# Patient Record
Sex: Female | Born: 1970 | Race: White | Hispanic: No | Smoking: Former smoker
Health system: Southern US, Community
[De-identification: ages and names within clinical notes are randomized; demographics above are authoritative.]

## PROBLEM LIST (undated history)

## (undated) DIAGNOSIS — R42 Dizziness and giddiness: Secondary | ICD-10-CM

## (undated) DIAGNOSIS — N870 Mild cervical dysplasia: Secondary | ICD-10-CM

## (undated) DIAGNOSIS — B009 Herpesviral infection, unspecified: Secondary | ICD-10-CM

## (undated) HISTORY — DX: Mild cervical dysplasia: N87.0

## (undated) HISTORY — DX: Dizziness and giddiness: R42

## (undated) HISTORY — PX: BREAST BIOPSY: SHX20

## (undated) HISTORY — DX: Herpesviral infection, unspecified: B00.9

## (undated) HISTORY — PX: COLPOSCOPY: SHX161

---

## 1997-03-25 ENCOUNTER — Encounter (HOSPITAL_COMMUNITY): Admission: RE | Admit: 1997-03-25 | Discharge: 1997-06-23 | Payer: Self-pay | Admitting: Obstetrics and Gynecology

## 1998-08-21 ENCOUNTER — Other Ambulatory Visit: Admission: RE | Admit: 1998-08-21 | Discharge: 1998-08-21 | Payer: Self-pay | Admitting: Obstetrics and Gynecology

## 1999-08-24 ENCOUNTER — Other Ambulatory Visit: Admission: RE | Admit: 1999-08-24 | Discharge: 1999-08-24 | Payer: Self-pay | Admitting: Obstetrics and Gynecology

## 2000-11-21 ENCOUNTER — Other Ambulatory Visit: Admission: RE | Admit: 2000-11-21 | Discharge: 2000-11-21 | Payer: Self-pay | Admitting: Obstetrics and Gynecology

## 2003-02-23 HISTORY — PX: GYNECOLOGIC CRYOSURGERY: SHX857

## 2003-06-23 DIAGNOSIS — N870 Mild cervical dysplasia: Secondary | ICD-10-CM

## 2003-06-23 HISTORY — DX: Mild cervical dysplasia: N87.0

## 2004-12-14 ENCOUNTER — Other Ambulatory Visit: Admission: RE | Admit: 2004-12-14 | Discharge: 2004-12-14 | Payer: Self-pay | Admitting: Gynecology

## 2005-08-12 ENCOUNTER — Inpatient Hospital Stay (HOSPITAL_COMMUNITY): Admission: AD | Admit: 2005-08-12 | Discharge: 2005-08-13 | Payer: Self-pay | Admitting: Gynecology

## 2005-08-16 ENCOUNTER — Inpatient Hospital Stay (HOSPITAL_COMMUNITY): Admission: AD | Admit: 2005-08-16 | Discharge: 2005-08-16 | Payer: Self-pay | Admitting: Gynecology

## 2005-09-10 ENCOUNTER — Inpatient Hospital Stay (HOSPITAL_COMMUNITY): Admission: RE | Admit: 2005-09-10 | Discharge: 2005-09-13 | Payer: Self-pay | Admitting: Gynecology

## 2005-10-28 ENCOUNTER — Other Ambulatory Visit: Admission: RE | Admit: 2005-10-28 | Discharge: 2005-10-28 | Payer: Self-pay | Admitting: Gynecology

## 2007-02-23 HISTORY — PX: TUBAL LIGATION: SHX77

## 2007-05-29 ENCOUNTER — Inpatient Hospital Stay (HOSPITAL_COMMUNITY): Admission: AD | Admit: 2007-05-29 | Discharge: 2007-05-31 | Payer: Self-pay | Admitting: Obstetrics & Gynecology

## 2007-05-29 ENCOUNTER — Encounter (INDEPENDENT_AMBULATORY_CARE_PROVIDER_SITE_OTHER): Payer: Self-pay | Admitting: Obstetrics and Gynecology

## 2008-03-28 ENCOUNTER — Ambulatory Visit: Payer: Self-pay | Admitting: Gynecology

## 2008-03-28 ENCOUNTER — Other Ambulatory Visit: Admission: RE | Admit: 2008-03-28 | Discharge: 2008-03-28 | Payer: Self-pay | Admitting: Gynecology

## 2008-03-28 ENCOUNTER — Encounter: Payer: Self-pay | Admitting: Gynecology

## 2008-12-31 ENCOUNTER — Ambulatory Visit: Payer: Self-pay | Admitting: Gynecology

## 2009-07-03 ENCOUNTER — Ambulatory Visit: Payer: Self-pay | Admitting: Gynecology

## 2009-07-03 ENCOUNTER — Other Ambulatory Visit: Admission: RE | Admit: 2009-07-03 | Discharge: 2009-07-03 | Payer: Self-pay | Admitting: Gynecology

## 2010-07-06 ENCOUNTER — Encounter (INDEPENDENT_AMBULATORY_CARE_PROVIDER_SITE_OTHER): Payer: 59 | Admitting: Gynecology

## 2010-07-06 ENCOUNTER — Other Ambulatory Visit: Payer: Self-pay | Admitting: Gynecology

## 2010-07-06 ENCOUNTER — Other Ambulatory Visit (HOSPITAL_COMMUNITY)
Admission: RE | Admit: 2010-07-06 | Discharge: 2010-07-06 | Disposition: A | Discharge status: Home / Self Care | Payer: 59 | Source: Ambulatory Visit | Attending: Gynecology | Admitting: Gynecology

## 2010-07-06 DIAGNOSIS — Z124 Encounter for screening for malignant neoplasm of cervix: Secondary | ICD-10-CM | POA: Insufficient documentation

## 2010-07-06 DIAGNOSIS — Z833 Family history of diabetes mellitus: Secondary | ICD-10-CM

## 2010-07-06 DIAGNOSIS — Z01419 Encounter for gynecological examination (general) (routine) without abnormal findings: Secondary | ICD-10-CM

## 2010-07-06 DIAGNOSIS — Z13220 Encounter for screening for lipoid disorders: Secondary | ICD-10-CM

## 2010-07-07 NOTE — Op Note (Signed)
NAME:  [name] [name]                ACCOUNT NO.:  [id number]   MEDICAL RECORD NO.:  [id number]          PATIENT TYPE:  INP   LOCATION:  9108                          FACILITY:  [facility]   PHYSICIAN:  Kendra H. Tenny Craw, MD     DATE OF BIRTH:  05/14/1970   DATE OF PROCEDURE:  05/29/2007  DATE OF DISCHARGE:                               OPERATIVE REPORT   ATTENDING PHYSICIAN:  Dr. Almon Hercules.   PREOPERATIVE DIAGNOSES:  1. 38 and 4-week intrauterine pregnancy.  2. History of cesarean section x2.  3. Labor.  4. Desired permanent sterilization.   POSTOPERATIVE DIAGNOSES:  1. 38 and 4-week intrauterine pregnancy.  2. History of cesarean section x2.  3. Labor.  4. Desired permanent sterilization.  5. Adhesive disease   PROCEDURE:  Repeat low transverse cesarean section via Pfannenstiel's  skin incision.  Bilateral partial salpingectomy with Pomeroy procedure  and lysis of adhesions.   FINDINGS:  There was a vigorous female infant in vertex presentation  with Apgar scores of 9 and 9, thick meconium-stained fluid was noted,  weight was not available at the time of this dictation.   PROCEDURE:  [name] [name] is a G3, P2 at 77 and [redacted] weeks gestational age  who presents to maternity admissions complaining of painful  contractions.  In maternity admissions she was noted to be contracting  every 2-3 minutes painfully, her cervix was 1 complete and -1 station.  Given her history of two prior cesarean sections, the decision was made  to proceed with repeat cesarean section for labor.  Additionally, the  patient desired permanent sterilization and the risks, benefits and  alternatives of this were discussed with the patient and we elected to  proceed with repeat cesarean section with bilateral partial  salpingectomy.  Following the appropriate informed consent.  The patient  was brought to the operating room where spinal anesthesia was  administered and found to be adequate.  She was placed in  dorsal supine  position with leftward tilt, prepped and draped in the normal sterile  fashion.  Scalpel was used to make a Pfannenstiel's skin incision which  was carried down through the underlying layers of soft tissue to the  fascia.  Fascia was incised in the midline.  Superior aspect of fascial  incision was grasped with Kocher clamps x2, tented up.  Underlying  rectus muscle was dissected off sharply with electrocautery unit.  The  same procedure was repeated on the inferior aspect of the fascial  incision.  The rectus muscles were then separated in the midline.  Abdominal peritoneum was identified, tented up, entered sharply with the  Metzenbaum and the incision was extended superiorly and inferiorly with  good visualization of the bladder.  Significant adhesive disease  involving the bladder and lower uterine segment were noted.  The  vesicouterine peritoneum was identified, tented up, entered sharply with  Metzenbaum and the incision was extended laterally with the Metzenbaums  and the bladder flap was created digitally.  Additionally, tubes and  lysis of adhesions was required with sharp dissection to get the bladder  off of the lower uterine segment.  A scalpel was then used to make a low  transverse incision on the uterus which was extended laterally with  blunt dissection.  Amniotomy was then performed.  Thick meconium-stained  fluid was noted at the time of amniotomy.  The fetal vertex was  identified, brought up through the uterine incision and delivered easily  followed by the body.  The infant cried vigorously on the operative  field, was bulb suctioned.  Cord was clamped and cut.  Infant was passed  to the waiting neonatologist.  Placenta was then manually extracted.  Uterus was exteriorized, cleared of all clot and debris.  An extension  of the uterine incision down the lower uterine segment to the level of  the cervix was noted.  This was repaired with a #1 chromic in a  running  locked fashion.  The uterine incision was then repaired with #1 chromic  in a running locked fashion and a second imbricating layer was not  performed, given tubal sterilization.  Uterine incision was found to be  hemostatic.  Attention was then turned to the tubes, first to the left  hand tube which was tented up with Babcock clamp, tied off x2 with 2-0  plain gut and the intervening portion of tube was then transected and  removed.  The same procedure was repeated on the right hand fallopian  tube.  The tubal ostia were noted bilaterally with hemostasis noted.  The uterus was then returned to the abdominal cavity.  The abdominal  cavity, cleared of all clot and debris.  The uterine incision was  reinspected and again found to be hemostatic.  The bladder was inspected  and found to be hemostatic and without the concern for injury.  The  urine was noted to be clear at this time and remained so throughout the  entire procedure.  The vesicouterine peritoneum was reapproximated with  2-0 Vicryl in a running fashion.  The rectus muscles were then  reapproximated with two number one figure-of-eight sutures and the  fascia was closed with a looped PDS and the skin was closed with  staples.  All sponge, lap, needle counts were correct x2.  The patient  tolerated the procedure well and was brought to the recovery room in  stable condition.      [name] March. Tenny Craw, MD  Electronically Signed     KHR/MEDQ  D:  05/29/2007  T:  05/30/2007  Job:  [id number]

## 2010-07-10 NOTE — H&P (Signed)
NAME:  [name] [name], [name] [name]                ACCOUNT NO.:  [id number]   MEDICAL RECORD NO.:  [id number]          PATIENT TYPE:  INP   LOCATION:  9156                          FACILITY:  [facility]   PHYSICIAN:  Juan H. Lily Peer, M.D.DATE OF BIRTH:  February 03, 1971   DATE OF ADMISSION:  08/12/2005  DATE OF DISCHARGE:                                HISTORY & PHYSICAL   CHIEF COMPLAINT:  1.  A 34-3/[redacted] weeks gestation.  2.  Oligohydramnios.  3.  Previous cesarean section.  4.  Prior history of premature delivery at [redacted] weeks gestation.   HISTORY:  Patient is a 40 year old gravida 2, para 1 with a last menstrual  period of December 14, 2004.  Estimated date of confinement September 20, 2005.  Currently 34-3/[redacted] weeks gestation.  Patient presented to the office today  stating that she was having brownish discharge and questionable cramps and  pressure in her lower abdomen.  A fetal fibronectin was done.  A wet prep  was done.  The wet prep demonstrated evidence of monilial lis.  The fetal  fibronectin result came back, and at the time of this dictation, it was  reported to be negative.  An ultrasound was done, due to the fact there was  a question, there was thinning of the lower uterine segment.  The cervix was  found to be 3 cm and closed.  Estimated fetal weight of 5 pounds, 3 ounces  in the breech presentation.  The AFI was low at 6.5 cm and 2 percentile for  gestational age.  Patient had a prior cesarean section at breech because of  the preterm premature rupture of membranes at [redacted] weeks gestation.  Patient  denied any ill symptoms.  No rupture of membranes reported.  With the  patient's history of premature rupture of membranes with preterm delivery  and her brownish discharge, it was decided to bring the patient into the  hospital for intravenous hydration secondary to oligohydramnios and  continuous monitoring of the fetus with follow-up  ultrasound in 24 hours in  the hospital.  Also, we had discussed  betamethasone for fetal lung  maturation in the event of imminent delivery.  Finally, we had discussed as  well the possibility of administering 17 alpha hydroxyprogesterone Caproate  weekly until 36 weeks to prevent premature delivery.  The patient has been  scheduled for a repeat cesarean section on Friday, July 20th.  The patient's  prenatal course secondary to fact of advanced maternal age, she was offered  genetic amniocentesis and/or first trimester screening, she opted to proceed  with a first trimester screening, which was reported to be normal, and the  alpha fetoprotein at 15-1/[redacted] weeks gestation, was normal.  She had declined  cystic fibrosis screening.  She had smoked during her pregnancy and stopped.  Also, she had been on an antidepressant agent, such as Wellbutrin, through  her pregnancy as well.   PAST MEDICAL HISTORY:  1.  Past history of preterm premature rupture of membranes and delivered via      cesarean section secondary to breech at [redacted] week gestation in  the past.  2.  Prior history of cigarette consumption.  3.  Depression, on Wellbutrin.   ALLERGIES:  None.   REVIEW OF SYSTEMS:  See Hollister form.   PHYSICAL EXAMINATION:  VITAL SIGNS:  Blood pressure 118/76.  Urine was  negative for protein or glucose.  Weight 181 pounds.  HEENT:  Unremarkable.  NECK:  Supple.  Trachea is midline.  No carotid bruits.  No thyromegaly.  LUNGS:  Clear to auscultation without any rhonchi or wheezes.  HEART:  Regular rate and rhythm.  No murmurs or gallops.  BREASTS:  Not done.  ABDOMEN:  Soft and nontender.  Breech presentation.  Positive fetal heart  tones.  PELVIC:  Cervix closed, 90% effaced, high.  EXTREMITIES:  DTRs 1+.  Negative clonus.   PRENATAL LABS:  O+ blood type.  Negative antibody screen.  VDRL was  nonreactive.  Rubella immune.  Hepatitis B surface antigen and HIV were  negative.  Diabetes screen was abnormal.  The patient had a normal three  hour GTT.    ASSESSMENT:  A 40 year old gravida 2, para 1 at 34-[redacted] weeks gestation with  brownish discharge on presentation to the office today.  History of  premature rupture of membranes and delivery at [redacted] weeks gestation via  cesarean section.  The patient on ultrasound was found to have a cervix of 3  cm.  Fetal heart tones were appreciated, but the AFI was low at 6.5 cm.  Estimated fetal weight was 5 pounds, 3 ounces, and also in the breech  presentation.  The patient will be admitted to the hospital for IV  hydration, rest, as well as for continuous monitoring.  Will repeat the  ultrasound in the hospital tomorrow to include Doppler flow studies.  The  patient's fetal fibronectin was negative.  Will admission betamethasone 12.5  mg IM upon admission and repeat in 24 hours, in the event of imminent  delivery.  She will use Monistat while at the hospital intravaginally for  three days.  We have discussed administering 17 alpha hydroxyprogesterone  Caproate (17P) 250 mg IM and then weekly until [redacted] week gestation in an  effort to prevent preterm delivery.  The risks, benefits, and pro's and  con's of all the above were discussed with the patient and the concerns.  All questions were answered.  Will follow accordingly.Gaetano Hawthorne. Lily Peer, M.D.  Electronically Signed     JHF/MEDQ  D:  08/12/2005  T:  08/12/2005  Job:  [id number]

## 2010-07-10 NOTE — Op Note (Signed)
NAME:  SCHNEPF, Jennae                ACCOUNT NO.:  [id number]   MEDICAL RECORD NO.:  [id number]          PATIENT TYPE:  INP   LOCATION:  9139                          FACILITY:  [facility]   PHYSICIAN:  Timothy P. Fontaine, M.D.DATE OF BIRTH:  1970/04/14   DATE OF PROCEDURE:  09/10/2005  DATE OF DISCHARGE:                                 OPERATIVE REPORT   PREOPERATIVE DIAGNOSES:  Term pregnancy, prior cesarean section, desires  repeat cesarean section, breech presentation.   POSTOPERATIVE DIAGNOSES:  Term pregnancy, prior cesarean section, desires  repeat cesarean section, breech presentation.   PROCEDURE:  Repeat low transverse cervical cesarean section.   SURGEON:  Timothy P. Fontaine, M.D.   ASSISTANT:  Reynaldo Minium, M.D.   ANESTHETIC:  Spinal.   ESTIMATED BLOOD LOSS:  Less than 500 mL.   COMPLICATIONS:  None.   SPECIMENS:  1.  Cord blood  2.  Public cord blood banking.   FINDINGS:  At 7:47 normal female, Apgars 08/09, weight 6 pounds 2 ounces.  Pelvic anatomy noted to be normal.  Infant was in the footling breech  presentation.   PROCEDURE:  The patient underwent spinal anesthesia, was placed left tilt  supine position, received an abdominal preparation with Betadine solution  and Foley catheter was placed in sterile technique and the patient was  draped in usual fashion per nursing personnel.  After assuring adequate  anesthesia, the abdomen sharply entered through repeat Pfannenstiel incision  achieving adequate hemostasis at all levels.  Bladder flap was sharply  bluntly developed without difficulty and the uterus was sharply entered in  the lower uterine segment and bluntly extended laterally.  Bulging membranes  were ruptured.  Fluid noted to be clear.  The infant was in the footling  breech presentation and underwent a breech extraction delivery.  The nares  and mouth were suctioned.  The cord doubly clamped and cut and the infant  was handed to pediatrics in  attendance.  Samples of cord blood were  obtained.  Placenta was spontaneously extruded, noted to be intact and was  handed off for public cord blood banking collection.  The uterus was  exteriorized, endometrial cavity explored with the sponge to remove all  placental membrane fragments.  The patient received 1 gram Ancef antibiotic  prophylaxis at this time.  Uterine incision was then closed in two layers  using 0 Vicryl suture first in a running interlocking stitch followed by  imbricating stitch.  Uterus was returned to the abdomen which was copiously  irrigated.  Adequate hemostasis visualized.  Anterior fascia was then  reapproximated using 0 Vicryl suture starting at the angle, meeting in the  middle.  Subcutaneous tissues were irrigated.  Adequate  hemostasis achieved with electrocautery.  The skin reapproximated with 4-0  Vicryl in a running subcuticular stitch.  Steri-Strips, Benzoin applied,  pressure dressing applied.  The patient taken to recovery room in good  condition having tolerated procedure well.      Timothy P. Fontaine, M.D.  Electronically Signed     TPF/MEDQ  D:  09/10/2005  T:  09/10/2005  Job:  [id number] 

## 2010-07-10 NOTE — H&P (Signed)
NAME:  [name] [name]                ACCOUNT NO.:  [id number]   MEDICAL RECORD NO.:  [id number]          PATIENT TYPE:  INP   LOCATION:                                [facility]   PHYSICIAN:  Timothy P. Fontaine, M.D.DATE OF BIRTH:  [birth year]   DATE OF ADMISSION:  09/10/2005  DATE OF DISCHARGE:                                HISTORY & PHYSICAL   CHIEF COMPLAINT:  1.  Pregnancy at term.  2.  Prior cesarean section for repeat cesarean section.   HISTORY OF PRESENT ILLNESS:  A 40 year old G39, P72 female at term gestation  with history of prior cesarean section for breech presentation who desires  repeat cesarean section.  Pregnancy had been complicated by low AFI for  which she has been followed with stable values in the 10 range.  Her  antepartum testing, to include NSTs, have been reassuring and her pregnancy  otherwise has been uncomplicated.  Beta Strep noted to be negative.  For  remainder of history, see [name].   PHYSICAL EXAMINATION:  HEENT:  Normal.  LUNGS:  Clear.  CARDIAC:  Regular rate.  No rubs, murmurs or gallops.  ABDOMEN:  Gravid fundus consistent with term.  Positive fetal heart tones.  PELVIC:  Deferred.   ASSESSMENT AND PLAN:  A 40 year old G2, P37 female, term gestation, history  of prior cesarean section, who desires repeat cesarean section.  She has  been noted to be in breech presentation on recent ultrasound, but again  regardless, even if there was a spontaneous version, she would want to  proceed with a planned repeat cesarean section.  I reviewed with her what is  involved with a repeat cesarean section to include the expected  intraoperative, postoperative courses, use of spinal anesthesia.  The risks  of infection requiring prolonged antibiotics, re-operation for abscess  drainage, wound complications requiring opening and draining of wounds,  closure by secondary intention, was all discussed, understood and accepted.  The risks of inadvertant  injury to internal organs, including bowel,  bladder, ureters, vessels, and nerves necessitating major exploratory  reparative surgeries and future reparative surgeries, including ostomy  formation was all discussed, understood and accepted.  The risks of fetal  injury during the birthing process, including musculoskeletal, neural, and  scalp injuries were all reviewed with her.  Again, the infant had been in  the breech presentation during antepartum assessments, but even if there was  a spontaneous version at the time of cesarean section, she would prefer a  repeat cesarean section and declines trial of labor options.  The patient's  questions were answered to her satisfaction and she is ready to proceed with  surgery.      Timothy P. Fontaine, M.D.  Electronically Signed     TPF/MEDQ  D:  09/09/2005  T:  09/09/2005  Job:  [id number]

## 2010-07-10 NOTE — Discharge Summary (Signed)
NAME:  [name] [name], [name] [name]                ACCOUNT NO.:  [id number]   MEDICAL RECORD NO.:  [id number]          PATIENT TYPE:  INP   LOCATION:  9139                          [facility]ACILITY:  [facility]   PHYSICIAN:  Timothy P. Fontaine, M.D.DATE OF BIRTH:  10-28-1970   DATE OF ADMISSION:  09/10/2005  DATE OF DISCHARGE:  09/13/2005                                 DISCHARGE SUMMARY   DISCHARGE DIAGNOSES:  1.  Pregnancy at term, delivered.  2.  Breech presentation.  3.  Prior Cesarean section, for repeat Cesarean section.  4.  Anemia secondary to pregnancy and blood loss.   PROCEDURE:  Repeat low transverse cervical Cesarean section, September 10, 2005,  Dr. Colin Broach.   HOSPITAL COURSE:  Patient is a 40 year old G2, P77 female with history of  prior Cesarean section for repeat Cesarean section, also noted that the  infant was in the breech presentation.  The patient underwent an  uncomplicated repeat low transverse cervical Cesarean section September 10, 2005  producing a normal female infant, weight 6 pounds, 2 ounces, Apgar's of 8  and 9 in the footling breech presentation.  The patient's postoperative  course was uncomplicated.  She was discharged home on postoperative day #3  ambulating well, tolerating a regular diet with a postoperative hemoglobin  of 8.7.  The patient's blood type is O positive and her rubella titer is  positive.  She received precautions, instructions and followup and will be  seen in the office in six weeks following discharge and was given a  prescription for Tylox #25, one to two p.o. q.4-6h. p.r.n. pain.      Timothy P. Fontaine, M.D.  Electronically Signed     TPF/MEDQ  D:  09/13/2005  T:  09/13/2005  Job:  [id number]

## 2010-07-10 NOTE — Discharge Summary (Signed)
NAMECITLALI, [name] [name]                ACCOUNT NO.:  [id number]   MEDICAL RECORD NO.:  [id number]          PATIENT TYPE:  INP   LOCATION:  9108                          [facility]   PHYSICIAN:  Ilda Mori, M.D.   DATE OF BIRTH:  February 19, 1971   DATE OF ADMISSION:  05/29/2007  DATE OF DISCHARGE:  05/31/2007                               DISCHARGE SUMMARY   FINAL DIAGNOSES:  1. Intrauterine gestation at 11 and 4/7th weeks.  2. History of prior cesarean sections x2.  3. Active labor.  4. Desires permanent sterilization.  5. Lower uterine adhesions.   PROCEDURE:  Repeat low transverse cesarean section, bilateral partial  salpingectomy with Pomeroy procedure, and lysis of adhesions.   SURGEON:  Freddrick March. Tenny Craw, MD   COMPLICATIONS:  None.   HOSPITAL COURSE:  This 40 year old G3, P 2-0-0-2 presents at 48 and  4/7th weeks' gestation in active labor.  The patient's antepartum course  up to this point had been complicated by advanced maternal age.  The  patient did have a first trimester screen that did show an elevated Down  syndrome risk.  The patient was seen perinatal and declined  amniocentesis.  Otherwise, the patient's antepartum course has been  uncomplicated.  She did have a history of two prior cesarean sections  and desires permanent sterilization after this pregnancy as well.  The  patient was taken to the operating room on May 29, 2007 where a repeat  low transverse cesarean section was performed with the delivery of a 6  pound 3 ounce female infant with Apgars of 9 and 9.  The delivery went  without complications.  There were some adhesions that were lysed.  The  patient still expressed her desires for permanent sterilization which  was performed without complications.  The patient's postoperative course  was benign without any significant fevers.  The patient was felt ready  for discharge on postoperative day #2.   DISCHARGE INSTRUCTIONS:  She was sent home on a regular  diet, told to  decrease activities, told to continue her prenatal vitamins and her  Wellbutrin 150 mg daily that she was already taking.  She was given  Tylox #30 1-2 every 4 hours as needed for pain, told she could use over-  the-counter ibuprofen up to 600 mg every 6 hours as needed for pain and  was to follow up in our office in 4 weeks.  Instructions and precautions  were reviewed with the patient.   LABS ON DISCHARGE:  The patient had a hemoglobin of 10.1, white blood  cell count of 15.1, and platelets of 210,000.      Leilani Able, P.A.-C.      Ilda Mori, M.D.  Electronically Signed    MB/MEDQ  D:  06/20/2007  T:  06/21/2007  Job:  [id number]

## 2010-07-21 ENCOUNTER — Other Ambulatory Visit: Payer: 59

## 2010-07-21 ENCOUNTER — Ambulatory Visit (INDEPENDENT_AMBULATORY_CARE_PROVIDER_SITE_OTHER): Payer: 59 | Admitting: Gynecology

## 2010-07-21 DIAGNOSIS — N831 Corpus luteum cyst of ovary, unspecified side: Secondary | ICD-10-CM

## 2010-07-21 DIAGNOSIS — N949 Unspecified condition associated with female genital organs and menstrual cycle: Secondary | ICD-10-CM

## 2010-07-21 DIAGNOSIS — N8300 Follicular cyst of ovary, unspecified side: Secondary | ICD-10-CM

## 2010-11-17 LAB — CBC
Hemoglobin: 10.1 — ABNORMAL LOW
MCHC: 34.4
MCV: 89.2
MCV: 90.2
Platelets: 210
Platelets: 254
RBC: 3.81 — ABNORMAL LOW
RDW: 14
WBC: 15.1 — ABNORMAL HIGH

## 2010-11-17 LAB — TYPE AND SCREEN: ABO/RH(D): O POS

## 2010-11-17 LAB — RPR: RPR Ser Ql: NONREACTIVE

## 2011-07-07 ENCOUNTER — Encounter: Payer: Self-pay | Admitting: Gynecology

## 2011-07-07 ENCOUNTER — Ambulatory Visit (INDEPENDENT_AMBULATORY_CARE_PROVIDER_SITE_OTHER): Payer: 59 | Admitting: Gynecology

## 2011-07-07 VITALS — BP 122/70 | Ht 64.6 in | Wt 166.0 lb

## 2011-07-07 DIAGNOSIS — G4700 Insomnia, unspecified: Secondary | ICD-10-CM

## 2011-07-07 DIAGNOSIS — N898 Other specified noninflammatory disorders of vagina: Secondary | ICD-10-CM

## 2011-07-07 DIAGNOSIS — B009 Herpesviral infection, unspecified: Secondary | ICD-10-CM | POA: Insufficient documentation

## 2011-07-07 DIAGNOSIS — Z13220 Encounter for screening for lipoid disorders: Secondary | ICD-10-CM

## 2011-07-07 DIAGNOSIS — R5383 Other fatigue: Secondary | ICD-10-CM

## 2011-07-07 DIAGNOSIS — R5381 Other malaise: Secondary | ICD-10-CM

## 2011-07-07 DIAGNOSIS — Z131 Encounter for screening for diabetes mellitus: Secondary | ICD-10-CM

## 2011-07-07 DIAGNOSIS — Z01419 Encounter for gynecological examination (general) (routine) without abnormal findings: Secondary | ICD-10-CM

## 2011-07-07 LAB — CBC WITH DIFFERENTIAL/PLATELET
Basophils Absolute: 0.1 10*3/uL (ref 0.0–0.1)
Basophils Relative: 1 % (ref 0–1)
HCT: 40.9 % (ref 36.0–46.0)
Hemoglobin: 13.3 g/dL (ref 12.0–15.0)
MCH: 29.6 pg (ref 26.0–34.0)
MCHC: 32.5 g/dL (ref 30.0–36.0)
Monocytes Absolute: 0.5 10*3/uL (ref 0.1–1.0)
Monocytes Relative: 5 % (ref 3–12)
Neutro Abs: 4.8 10*3/uL (ref 1.7–7.7)
RDW: 13.8 % (ref 11.5–15.5)

## 2011-07-07 LAB — LIPID PANEL: LDL Cholesterol: 138 mg/dL — ABNORMAL HIGH (ref 0–99)

## 2011-07-07 LAB — GLUCOSE, RANDOM: Glucose, Bld: 83 mg/dL (ref 70–99)

## 2011-07-07 LAB — WET PREP FOR TRICH, YEAST, CLUE
Clue Cells Wet Prep HPF POC: NONE SEEN
Yeast Wet Prep HPF POC: NONE SEEN

## 2011-07-07 MED ORDER — BUPROPION HCL ER (XL) 150 MG PO TB24: 150.0000 mg | ORAL_TABLET | Freq: Every day | ORAL | Status: DC

## 2011-07-07 NOTE — Patient Instructions (Addendum)
Schedule mammogram. Call if sleeping continues to be an issue. Follow up in one year for annual gynecologic exam.

## 2011-07-07 NOTE — Progress Notes (Signed)
[name] [name] [birth year] [id number]        41 y.o.  for annual exam.  Several issues noted below  Past medical history,surgical history, medications, allergies, family history and social history were all reviewed and documented in the EPIC chart. ROS:  Was performed and pertinent positives and negatives are included in the history.  Exam: [name] [name] chaperone present Filed Vitals:   07/07/11 1155  BP: 122/70   General appearance  Normal Skin grossly normal Head/Neck normal with no cervical or supraclavicular adenopathy thyroid normal Lungs  clear Cardiac RR, without RMG Abdominal  soft, nontender, without masses, organomegaly or hernia Breasts  examined lying and sitting without masses, retractions, discharge or axillary adenopathy. Pelvic  Ext/BUS/vagina  normal slight white discharge.  Cervix  normal   Uterus  anteverted, normal size, shape and contour, midline and mobile nontender   Adnexa  Without masses or tenderness    Anus and perineum  normal   Rectovaginal  normal sphincter tone without palpated masses or tenderness.    Assessment/Plan:  41 y.o. female for annual exam.   Regular menses status post tubal sterilization. 1. Menorrhagia. Patient's menses tend to be heavier since her tubal sterilization. They are regular and monthly. Options for management were reviewed to include hormonal manipulation up to and including endometrial ablation. Patient's not interested in doing anything at this point. She'll ago and she wants to pursue. 2. Vaginal discharge. Patient notes that since her menses she's had a slight discharge. Her wet prep is unremarkable and I asked her to just watch it for now and if it persists to call me, as long as it resolves and will follow. 3. Insomnia. Patient is having some difficulty staying asleep. She is exercising regularly and falls asleep but then wakes up in the middle of the night. She takes little caffeine. Recommended OTC options. If it continues she'll  call me and I prescribed Ambien 10 mg to see if we can break the cycle. 4. Fatigue. Patient again is to start an exercise program notes some fatigue. We'll check a baseline TSH along with her CBC but assuming normal she'll continue with her exercise program and I think this will work itself out. 5. Mammography. Patients never had a mammogram and I strongly urged her to schedule this and she agrees to do so. SBE much we reviewed. 6. Pap smear. Patient had low-grade SIL with cryo-in 2005. Her subsequent Pap smears have all been normal. I discussed current screening guidelines. Her last Pap smear was 2012 I did not do a Pap smear today and will plan on every 3-5 year screen. 7. Wellbutrin. Patient is on Wellbutrin doing well wants to continue and I refilled her times a year. 8. Health maintenance. Baseline CBC glucose lipid profile urinalysis ordered along with her TSH. Assuming she continues well then she'll see me in a year, sooner as needed.    Dara Lords MD, 12:35 PM 07/07/2011

## 2011-07-08 ENCOUNTER — Other Ambulatory Visit: Payer: Self-pay | Admitting: Gynecology

## 2011-07-08 DIAGNOSIS — E785 Hyperlipidemia, unspecified: Secondary | ICD-10-CM

## 2011-07-08 LAB — URINALYSIS W MICROSCOPIC + REFLEX CULTURE
Casts: NONE SEEN
Crystals: NONE SEEN
Nitrite: NEGATIVE
Specific Gravity, Urine: <1.005 — ABNORMAL LOW (ref 1.005–1.030)
Squamous Epithelial / LPF: NONE SEEN
Urobilinogen, UA: 0.2 mg/dL (ref 0.0–1.0)

## 2012-07-03 ENCOUNTER — Other Ambulatory Visit: Payer: Self-pay | Admitting: Gynecology

## 2012-07-20 ENCOUNTER — Encounter: Payer: Self-pay | Admitting: Gynecology

## 2012-07-20 ENCOUNTER — Ambulatory Visit (INDEPENDENT_AMBULATORY_CARE_PROVIDER_SITE_OTHER): Payer: 59 | Admitting: Gynecology

## 2012-07-20 VITALS — BP 110/74 | Ht 65.0 in | Wt 144.0 lb

## 2012-07-20 DIAGNOSIS — Z13220 Encounter for screening for lipoid disorders: Secondary | ICD-10-CM

## 2012-07-20 DIAGNOSIS — Z01419 Encounter for gynecological examination (general) (routine) without abnormal findings: Secondary | ICD-10-CM

## 2012-07-20 LAB — COMPREHENSIVE METABOLIC PANEL
ALT: <8 U/L (ref 0–35)
AST: 13 U/L (ref 0–37)
Alkaline Phosphatase: 58 U/L (ref 39–117)
BUN: 11 mg/dL (ref 6–23)
CO2: 26 mEq/L (ref 19–32)
Chloride: 104 mEq/L (ref 96–112)
Glucose, Bld: 76 mg/dL (ref 70–99)
Total Protein: 6.2 g/dL (ref 6.0–8.3)

## 2012-07-20 LAB — CBC WITH DIFFERENTIAL/PLATELET
Basophils Absolute: 0 10*3/uL (ref 0.0–0.1)
Basophils Relative: 1 % (ref 0–1)
Eosinophils Relative: 1 % (ref 0–5)
Lymphocytes Relative: 30 % (ref 12–46)
Lymphs Abs: 1.9 10*3/uL (ref 0.7–4.0)
Neutro Abs: 3.8 10*3/uL (ref 1.7–7.7)
Platelets: 266 10*3/uL (ref 150–400)
RDW: 13.3 % (ref 11.5–15.5)

## 2012-07-20 LAB — LIPID PANEL
Cholesterol: 180 mg/dL (ref 0–200)
HDL: 46 mg/dL (ref >39)
LDL Cholesterol: 122 mg/dL — ABNORMAL HIGH (ref 0–99)
Total CHOL/HDL Ratio: 3.9 Ratio
Triglycerides: 60 mg/dL (ref <150)

## 2012-07-20 LAB — TSH: TSH: 3.628 u[IU]/mL (ref 0.350–4.500)

## 2012-07-20 MED ORDER — BUPROPION HCL ER (XL) 150 MG PO TB24: ORAL_TABLET | ORAL | Status: DC

## 2012-07-20 NOTE — Patient Instructions (Signed)
Call to Schedule your mammogram  Facilities in [place]: 1)  [facility] of [place], [place] Phone: [phone] 2)  [facility], [phone] Phone: [phone] 3)  Dr. Yolanda Bonine at [facility] [address] Phone: [phone]     Mammogram A mammogram is an X-ray test to find changes in a woman's breast. You should get a mammogram if:  You are 70 years of age or older  You have risk factors.   Your doctor recommends that you have one.  BEFORE THE TEST  Do not schedule the test the week before your period, especially if your breasts are sore during this time.  On the day of your mammogram:  Wash your breasts and armpits well. After washing, do not put on any deodorant or talcum powder on until after your test.   Eat and drink as you usually do.   Take your medicines as usual.   If you are diabetic and take insulin, make sure you:   Eat before coming for your test.   Take your insulin as usual.   If you cannot keep your appointment, call before the appointment to cancel. Schedule another appointment.  TEST  You will need to undress from the waist up. You will put on a hospital gown.   Your breast will be put on the mammogram machine, and it will press firmly on your breast with a piece of plastic called a compression paddle. This will make your breast flatter so that the machine can X-ray all parts of your breast.   Both breasts will be X-rayed. Each breast will be X-rayed from above and from the side. An X-ray might need to be taken again if the picture is not good enough.   The mammogram will last about 15 to 30 minutes.  AFTER THE TEST Finding out the results of your test Ask when your test results will be ready. Make sure you get your test results.  Document Released: 05/07/2008 Document Revised: 01/28/2011 Document Reviewed: 05/07/2008 [facility] Patient  Information 2012 [place], [place].  Consider Stop Smoking.  Help is available at [facility] smoking cessation program @ www.[place].com or 204-050-7094. OR 1-800-QUIT-NOW 7878549704) for free smoking cessation counseling.  Smokefree.gov (http://www.[place].com/) provides free, accurate, evidence-based information and professional assistance to help support the immediate and long-term needs of people trying to quit smoking.    Smoking Hazards Smoking cigarettes is extremely bad for your health. Tobacco smoke has over 200 known poisons in it. There are over 60 chemicals in tobacco smoke that cause cancer. Some of the chemicals found in cigarette smoke include:  Cyanide.  Benzene.  Formaldehyde.  Methanol (wood alcohol).  Acetylene (fuel used in welding torches).  Ammonia.  Cigarette smoke also contains the poisonous gases nitrogen oxide and carbon monoxide.  Cigarette smokers have an increased risk of many serious medical problems, including: Lung cancer.  Lung disease (such as pneumonia, bronchitis, and emphysema).  Heart attack and chest pain due to the heart not getting enough oxygen (angina).  Heart disease and peripheral blood vessel disease.  Hypertension.  Stroke.  Oral cancer (cancer of the lip, mouth, or voice box).  Bladder cancer.  Pancreatic cancer.  Cervical cancer.  Pregnancy complications, including premature birth.  Low birthweight babies.  Early menopause.  Lower estrogen level for women.  Infertility.  Facial wrinkles.  Blindness.  Increased risk of broken bones (fractures).  Senile dementia.  Stillbirths and smaller newborn babies, birth defects, and genetic damage to sperm.  Stomach ulcers and internal bleeding.  Children of smokers have an increased risk of the following, because of secondhand smoke exposure:  Sudden infant death syndrome (SIDS).  Respiratory infections.  Lung cancer.  Heart disease.  Ear infections.  Smoking causes  approximately: 90% of all lung cancer deaths in men.  80% of all lung cancer deaths in women.  90% of deaths from chronic obstructive lung disease.  Compared with nonsmokers, smoking increases the risk of: Coronary heart disease by 2 to 4 times.  Stroke by 2 to 4 times.  Men developing lung cancer by 23 times.  Women developing lung cancer by 13 times.  Dying from chronic obstructive lung diseases by 12 times.  Someone who smokes 2 packs a day loses about 8 years of his or her life. Even smoking lightly shortens your life expectancy by several years. You can greatly reduce the risk of medical problems for you and your family by stopping now. Smoking is the most preventable cause of death and disease in our society. Within days of quitting smoking, your circulation returns to normal, you decrease the risk of having a heart attack, and your lung capacity improves. There may be some increased phlegm in the first few days after quitting, and it may take months for your lungs to clear up completely. Quitting for 10 years cuts your lung cancer risk to almost that of a nonsmoker. WHY IS SMOKING ADDICTIVE? Nicotine is the chemical agent in tobacco that is capable of causing addiction or dependence.  When you smoke and inhale, nicotine is absorbed rapidly into the bloodstream through your lungs. Nicotine absorbed through the lungs is capable of creating a powerful addiction. Both inhaled and non-inhaled nicotine may be addictive.  Addiction studies of cigarettes and spit tobacco show that addiction to nicotine occurs mainly during the teen years, when young people begin using tobacco products.  WHAT ARE THE BENEFITS OF QUITTING?  There are many health benefits to quitting smoking.  Likelihood of developing cancer and heart disease decreases. Health improvements are seen almost immediately.  Blood pressure, pulse rate, and breathing patterns start returning to normal soon after quitting.  People who quit  may see an improvement in their overall quality of life.  Some people choose to quit all at once. Other options include nicotine replacement products, such as patches, gum, and nasal sprays. Do not use these products without first checking with your caregiver. QUITTING SMOKING It is not easy to quit smoking. Nicotine is addicting, and longtime habits are hard to change. To start, you can write down all your reasons for quitting, tell your family and friends you want to quit, and ask for their help. Throw your cigarettes away, chew gum or cinnamon sticks, keep your hands busy, and drink extra water or juice. Go for walks and practice deep breathing to relax. Think of all the money you are saving: around $1,000 a year, for the average pack-a-day smoker. Nicotine patches and gum have been shown to improve success at efforts to stop smoking. Zyban (bupropion) is an anti-depressant drug that can be prescribed to reduce nicotine withdrawal symptoms and to suppress the urge to smoke. Smoking is an addiction with both physical and psychological effects. Joining a stop-smoking support group can help you cope with the emotional issues. For more information and advice on programs to stop smoking, call your doctor, your local hospital, or these organizations: American Lung Association -  1-800-LUNGUSA   Smoking Cessation  This document explains the best ways for you to quit smoking and new treatments to help. It lists new medicines that can double or triple your chances of quitting and quitting for good. It also considers ways to avoid relapses and concerns you may have about quitting, including weight gain. NICOTINE: A POWERFUL ADDICTION If you have tried to quit smoking, you know how hard it can be. It is hard because nicotine is a very addictive drug. For some people, it can be as addictive as heroin or cocaine. Usually, people make 2 or 3 tries, or more, before finally being able to quit. Each time you try to  quit, you can learn about what helps and what hurts. Quitting takes hard work and a lot of effort, but you can quit smoking. QUITTING SMOKING IS ONE OF THE MOST IMPORTANT THINGS YOU WILL EVER DO.  You will live longer, feel better, and live better.   The impact on your body of quitting smoking is felt almost immediately:   Within 20 minutes, blood pressure decreases. Pulse returns to its normal level.   After 8 hours, carbon monoxide levels in the blood return to normal. Oxygen level increases.   After 24 hours, chance of heart attack starts to decrease. Breath, hair, and body stop smelling like smoke.   After 48 hours, damaged nerve endings begin to recover. Sense of taste and smell improve.   After 72 hours, the body is virtually free of nicotine. Bronchial tubes relax and breathing becomes easier.   After 2 to 12 weeks, lungs can hold more air. Exercise becomes easier and circulation improves.   Quitting will reduce your risk of having a heart attack, stroke, cancer, or lung disease:   After 1 year, the risk of coronary heart disease is cut in half.   After 5 years, the risk of stroke falls to the same as a nonsmoker.   After 10 years, the risk of lung cancer is cut in half and the risk of other cancers decreases significantly.   After 15 years, the risk of coronary heart disease drops, usually to the level of a nonsmoker.   If you are pregnant, quitting smoking will improve your chances of having a healthy baby.   The people you live with, especially your children, will be healthier.   You will have extra money to spend on things other than cigarettes.  FIVE KEYS TO QUITTING Studies have shown that these 5 steps will help you quit smoking and quit for good. You have the best chances of quitting if you use them together: 1. Get ready.  2. Get support and encouragement.  3. Learn new skills and behaviors.  4. Get medicine to reduce your nicotine addiction and use it  correctly.  5. Be prepared for relapse or difficult situations. Be determined to continue trying to quit, even if you do not succeed at first.  1. GET READY  Set a quit date.   Change your environment.   Get rid of ALL cigarettes, ashtrays, matches, and lighters in your home, car, and place of work.   Do not let people smoke in your home.   Review your past attempts to quit. Think about what worked and what did not.   Once you quit, do not smoke. NOT EVEN A PUFF!  2. GET SUPPORT AND ENCOURAGEMENT Studies have shown that you have a better chance of being successful if you have help. You can get support  in many ways.  Tell your family, friends, and coworkers that you are going to quit and need their support. Ask them not to smoke around you.   Talk to your caregivers (doctor, dentist, nurse, pharmacist, psychologist, and/or smoking counselor).   Get individual, group, or telephone counseling and support. The more counseling you have, the better your chances are of quitting. Programs are available at [employer] and health centers. Call your local health department for information about programs in your area.   Spiritual beliefs and practices may help some smokers quit.   Quit meters are Photographer that keep track of quit statistics, such as amount of "quit-time," cigarettes not smoked, and money saved.   Many smokers find one or more of the many self-help books available useful in helping them quit and stay off tobacco.  3. LEARN NEW SKILLS AND BEHAVIORS  Try to distract yourself from urges to smoke. Talk to someone, go for a walk, or occupy your time with a task.   When you first try to quit, change your routine. Take a different route to work. Drink tea instead of coffee. Eat breakfast in a different place.   Do something to reduce your stress. Take a hot bath, exercise, or read a book.   Plan something enjoyable to do every day. Reward  yourself for not smoking.   Explore interactive web-based programs that specialize in helping you quit.  4. GET MEDICINE AND USE IT CORRECTLY Medicines can help you stop smoking and decrease the urge to smoke. Combining medicine with the above behavioral methods and support can quadruple your chances of successfully quitting smoking. The U.S. Food and Drug Administration (FDA) has approved 7 medicines to help you quit smoking. These medicines fall into 3 categories.  Nicotine replacement therapy (delivers nicotine to your body without the negative effects and risks of smoking):   Nicotine gum: Available over-the-counter.   Nicotine lozenges: Available over-the-counter.   Nicotine inhaler: Available by prescription.   Nicotine nasal spray: Available by prescription.   Nicotine skin patches (transdermal): Available by prescription and over-the-counter.   Antidepressant medicine (helps people abstain from smoking, but how this works is unknown):   Bupropion sustained-release (SR) tablets: Available by prescription.   Nicotinic receptor partial agonist (simulates the effect of nicotine in your brain):   Varenicline tartrate tablets: Available by prescription.   Ask your caregiver for advice about which medicines to use and how to use them. Carefully read the information on the package.   Everyone who is trying to quit may benefit from using a medicine. If you are pregnant or trying to become pregnant, nursing an infant, you are under age 88, or you smoke fewer than 10 cigarettes per day, talk to your caregiver before taking any nicotine replacement medicines.   You should stop using a nicotine replacement product and call your caregiver if you experience nausea, dizziness, weakness, vomiting, fast or irregular heartbeat, mouth problems with the lozenge or gum, or redness or swelling of the skin around the patch that does not go away.   Do not use any other product containing nicotine  while using a nicotine replacement product.   Talk to your caregiver before using these products if you have diabetes, heart disease, asthma, stomach ulcers, you had a recent heart attack, you have high blood pressure that is not controlled with medicine, a history of irregular heartbeat, or you have been prescribed medicine to help you quit smoking.  5.  BE PREPARED FOR RELAPSE OR DIFFICULT SITUATIONS  Most relapses occur within the first 3 months after quitting. Do not be discouraged if you start smoking again. Remember, most people try several times before they finally quit.   You may have symptoms of withdrawal because your body is used to nicotine. You may crave cigarettes, be irritable, feel very hungry, cough often, get headaches, or have difficulty concentrating.   The withdrawal symptoms are only temporary. They are strongest when you first quit, but they will go away within 10 to 14 days.  Here are some difficult situations to watch for:  Alcohol. Avoid drinking alcohol. Drinking lowers your chances of successfully quitting.   Caffeine. Try to reduce the amount of caffeine you consume. It also lowers your chances of successfully quitting.   Other smokers. Being around smoking can make you want to smoke. Avoid smokers.   Weight gain. Many smokers will gain weight when they quit, usually less than 10 pounds. Eat a healthy diet and stay active. Do not let weight gain distract you from your main goal, quitting smoking. Some medicines that help you quit smoking may also help delay weight gain. You can always lose the weight gained after you quit.   Bad mood or depression. There are a lot of ways to improve your mood other than smoking.  If you are having problems with any of these situations, talk to your caregiver. SPECIAL SITUATIONS AND CONDITIONS Studies suggest that everyone can quit smoking. Your situation or condition can give you a special reason to quit.  Pregnant women/new  mothers: By quitting, you protect your baby's health and your own.   Hospitalized patients: By quitting, you reduce health problems and help healing.   Heart attack patients: By quitting, you reduce your risk of a second heart attack.   Lung, head, and neck cancer patients: By quitting, you reduce your chance of a second cancer.   Parents of children and adolescents: By quitting, you protect your children from illnesses caused by secondhand smoke.  QUESTIONS TO THINK ABOUT Think about the following questions before you try to stop smoking. You may want to talk about your answers with your caregiver.  Why do you want to quit?   If you tried to quit in the past, what helped and what did not?   What will be the most difficult situations for you after you quit? How will you plan to handle them?   Who can help you through the tough times? Your family? Friends? Caregiver?   What pleasures do you get from smoking? What ways can you still get pleasure if you quit?  Here are some questions to ask your caregiver:  How can you help me to be successful at quitting?   What medicine do you think would be best for me and how should I take it?   What should I do if I need more help?   What is smoking withdrawal like? How can I get information on withdrawal?  Quitting takes hard work and a lot of effort, but you can quit smoking.

## 2012-07-20 NOTE — Progress Notes (Signed)
KAYDINCE TOWLES [birth year] [id number]        42 y.o.  [id number] for annual exam.  Doing well.  Past medical history,surgical history, medications, allergies, family history and social history were all reviewed and documented in the EPIC chart. ROS:  Was performed and pertinent positives and negatives are included in the history.  Exam: Kim assistant Filed Vitals:   07/20/12 1134  BP: 110/74  Height: 5\' 5"  (1.651 m)  Weight: 144 lb (65.318 kg)   General appearance  Normal Skin grossly normal Head/Neck normal with no cervical or supraclavicular adenopathy thyroid normal Lungs  clear Cardiac RR, without RMG Abdominal  soft, nontender, without masses, organomegaly or hernia Breasts  examined lying and sitting without masses, retractions, discharge or axillary adenopathy. Pelvic  Ext/BUS/vagina  normal with slight menses flow  Cervix  normal   Uterus  anteverted, normal size, shape and contour, midline and mobile nontender   Adnexa  Without masses or tenderness    Anus and perineum  normal   Rectovaginal  normal sphincter tone without palpated masses or tenderness.    Assessment/Plan:  42 y.o. G30P3003 female for annual exam, irregular menses, tubal sterilization.   1. Menses fairly heavy for 2-3 days. Regular with no intermenstrual bleeding. We've discussed this previously and reviewed various options and she is not interested the would prefer just monitoring. 2. Pap smear 2012. No Pap smear done today. History of LGSIL with cryo-surgery 2005 normal Pap smears since then. Plan repeat Pap smear next year at five-year interval. 3. Mammography never. I strongly urged her to schedule. I reviewed the benefits of early detection. Patient promises to schedule. SBE monthly reviewed. 4. Wellbutrin. Patient doing well with this for mood stabilization and wants to continue and I refilled her time seen year. 5. Stop smoking again reviewed and encouraged. Strategies discussed. 6. Health maintenance.  Baseline CBC comprehensive metabolic panel lipid profile TSH and urinalysis ordered. Followup one year, sooner as needed.    Dara Lords MD, 12:00 PM 07/20/2012

## 2012-07-21 LAB — URINALYSIS W MICROSCOPIC + REFLEX CULTURE
Nitrite: NEGATIVE
Urobilinogen, UA: 0.2 mg/dL (ref 0.0–1.0)

## 2012-07-25 ENCOUNTER — Encounter: Payer: Self-pay | Admitting: Gynecology

## 2012-08-04 ENCOUNTER — Other Ambulatory Visit: Payer: Self-pay | Admitting: Gynecology

## 2012-10-29 ENCOUNTER — Encounter (HOSPITAL_BASED_OUTPATIENT_CLINIC_OR_DEPARTMENT_OTHER): Payer: Self-pay

## 2012-10-29 ENCOUNTER — Emergency Department (HOSPITAL_BASED_OUTPATIENT_CLINIC_OR_DEPARTMENT_OTHER)
Admission: EM | Admit: 2012-10-29 | Discharge: 2012-10-29 | Disposition: A | Discharge status: Home / Self Care | Payer: 59 | Attending: Emergency Medicine | Admitting: Emergency Medicine

## 2012-10-29 DIAGNOSIS — F17200 Nicotine dependence, unspecified, uncomplicated: Secondary | ICD-10-CM | POA: Insufficient documentation

## 2012-10-29 DIAGNOSIS — Z79899 Other long term (current) drug therapy: Secondary | ICD-10-CM | POA: Insufficient documentation

## 2012-10-29 DIAGNOSIS — Z87410 Personal history of cervical dysplasia: Secondary | ICD-10-CM | POA: Insufficient documentation

## 2012-10-29 DIAGNOSIS — S61219A Laceration without foreign body of unspecified finger without damage to nail, initial encounter: Secondary | ICD-10-CM

## 2012-10-29 DIAGNOSIS — Z8619 Personal history of other infectious and parasitic diseases: Secondary | ICD-10-CM | POA: Insufficient documentation

## 2012-10-29 DIAGNOSIS — S61209A Unspecified open wound of unspecified finger without damage to nail, initial encounter: Secondary | ICD-10-CM | POA: Insufficient documentation

## 2012-10-29 DIAGNOSIS — W268XXA Contact with other sharp object(s), not elsewhere classified, initial encounter: Secondary | ICD-10-CM | POA: Insufficient documentation

## 2012-10-29 DIAGNOSIS — Z23 Encounter for immunization: Secondary | ICD-10-CM | POA: Insufficient documentation

## 2012-10-29 DIAGNOSIS — Y9389 Activity, other specified: Secondary | ICD-10-CM | POA: Insufficient documentation

## 2012-10-29 DIAGNOSIS — Y92009 Unspecified place in unspecified non-institutional (private) residence as the place of occurrence of the external cause: Secondary | ICD-10-CM | POA: Insufficient documentation

## 2012-10-29 MED ORDER — TETANUS-DIPHTH-ACELL PERTUSSIS 5-2.5-18.5 LF-MCG/0.5 IM SUSP
0.5000 mL | Freq: Once | INTRAMUSCULAR | Status: AC
  Administered 2012-10-29: 0.5 mL via INTRAMUSCULAR
  Filled 2012-10-29: qty 0.5

## 2012-10-29 MED ORDER — LIDOCAINE HCL 2 % IJ SOLN
INTRAMUSCULAR | Status: AC
  Filled 2012-10-29: qty 20

## 2012-10-29 NOTE — ED Notes (Signed)
Patient here with finger laceration to right hand middle finger on garden shears. No bleeding on arrival, good sensation to digit

## 2012-10-29 NOTE — ED Provider Notes (Signed)
CSN: [id number]     Arrival date & time 10/29/12  1120 History   First MD Initiated Contact with Patient 10/29/12 1250     Chief Complaint  Patient presents with  . Extremity Laceration   (Consider location/radiation/quality/duration/timing/severity/associated sxs/prior Treatment) HPI Comments: Patient lacerated right third finger tip just pta on hedge clipper.  No other injury.  Injury occurred through leather glove while doing yard work.  Last tetanus unknown.   The history is provided by the patient.    Past Medical History  Diagnosis Date  . CIN I (cervical intraepithelial neoplasia I) 06/2003  . HSV-2 infection    Past Surgical History  Procedure Laterality Date  . Cesarean section  [phone]  . Tubal ligation  2009  . Colposcopy    . Gynecologic cryosurgery  2005    LGSIL   Family History  Problem Relation Age of Onset  . Hypertension Mother   . Other Mother     comitted suicide and may have had a cancer , had lost 40 pounds in less than 2 months  . Hyperlipidemia Mother   . Thyroid disease Mother     underactive  . Diabetes Father   . Hypertension Maternal Grandmother   . Stroke Maternal Grandmother   . Colon cancer Maternal Uncle   . Colon cancer Maternal Grandfather    History  Substance Use Topics  . Smoking status: Current Every Day Smoker -- 0.50 packs/day    Types: Cigarettes  . Smokeless tobacco: Never Used  . Alcohol Use: 1.0 oz/week    2 drink(s) per week   OB History   Grav Para Term Preterm Abortions TAB SAB Ect Mult Living   3 3 3       3      Review of Systems  All other systems reviewed and are negative.    Allergies  Review of patient's allergies indicates no known allergies.  Home Medications   Current Outpatient Rx  Name  Route  Sig  Dispense  Refill  . buPROPion (WELLBUTRIN XL) 150 MG 24 hr tablet      TAKE 1 TABLET BY MOUTH ONCE DAILY   30 tablet   11   . WELLBUTRIN XL 150 MG 24 hr tablet      TAKE 1 TABLET BY MOUTH  EVERY DAY   30 tablet   11    BP 118/82  Pulse 85  Temp(Src) 98.9 F (37.2 C) (Oral)  Resp 18  SpO2 97% Physical Exam  Nursing note and vitals reviewed. Constitutional: She is oriented to person, place, and time. She appears well-developed and well-nourished.  HENT:  Head: Normocephalic and atraumatic.  Musculoskeletal: Normal range of motion.       Hands: Right hand with third finger tip laceration 2 cm as shown on diagram, full arom, two point sensation intact  Neurological: She is alert and oriented to person, place, and time.  Skin: Skin is warm and dry.  Psychiatric: She has a normal mood and affect. Her behavior is normal. Judgment and thought content normal.    ED Course  LACERATION REPAIR Date/Time: 10/29/2012 1:12 PM Performed by: Hilario Quarry Authorized by: Hilario Quarry Consent: Verbal consent obtained. The procedure was performed in an emergent situation. Risks and benefits: risks, benefits and alternatives were discussed Patient identity confirmed: verbally with patient Body area: upper extremity Laceration length: 2 cm Tendon involvement: none Nerve involvement: none Vascular damage: no Anesthesia: digital block Local anesthetic: lidocaine 1% without epinephrine Anesthetic  total: 3 ml Preparation: Patient was prepped and draped in the usual sterile fashion. Irrigation solution: saline Irrigation method: syringe Amount of cleaning: extensive Debridement: none Degree of undermining: none Skin closure: 4-0 Prolene Number of sutures: 1 Technique: simple Approximation: loose Approximation difficulty: simple Dressing: 4x4 sterile gauze Patient tolerance: Patient tolerated the procedure well with no immediate complications.   (including critical care time) Labs Review Labs Reviewed - No data to display Imaging Review No results found.  MDM  Tdap booster given.Pressure irrigation performed. Laceration occurred < 8 hours prior to repair which was  well tolerated. Pt has no co morbidities to effect normal wound healing. Discussed suture home care w pt and answered questions. Pt to f-u for wound check and suture removal in 7 days. Pt is hemodynamically stable w no complaints prior to dc.       Hilario Quarry, MD 10/29/12 1314

## 2012-12-28 ENCOUNTER — Other Ambulatory Visit: Payer: Self-pay

## 2013-08-06 ENCOUNTER — Other Ambulatory Visit: Payer: Self-pay | Admitting: Gynecology

## 2013-09-09 ENCOUNTER — Other Ambulatory Visit: Payer: Self-pay | Admitting: Gynecology

## 2013-09-13 ENCOUNTER — Other Ambulatory Visit: Payer: Self-pay | Admitting: Gynecology

## 2013-11-05 ENCOUNTER — Other Ambulatory Visit: Payer: Self-pay | Admitting: Gynecology

## 2013-11-06 ENCOUNTER — Other Ambulatory Visit: Payer: Self-pay | Admitting: Gynecology

## 2013-11-06 ENCOUNTER — Other Ambulatory Visit (HOSPITAL_COMMUNITY)
Admission: RE | Admit: 2013-11-06 | Discharge: 2013-11-06 | Disposition: A | Discharge status: Home / Self Care | Payer: 59 | Source: Ambulatory Visit | Attending: Gynecology | Admitting: Gynecology

## 2013-11-06 ENCOUNTER — Ambulatory Visit (INDEPENDENT_AMBULATORY_CARE_PROVIDER_SITE_OTHER): Payer: 59 | Admitting: Gynecology

## 2013-11-06 ENCOUNTER — Encounter: Payer: Self-pay | Admitting: Gynecology

## 2013-11-06 VITALS — BP 120/68 | Ht 64.5 in | Wt 144.0 lb

## 2013-11-06 DIAGNOSIS — Z01419 Encounter for gynecological examination (general) (routine) without abnormal findings: Secondary | ICD-10-CM | POA: Insufficient documentation

## 2013-11-06 DIAGNOSIS — Z113 Encounter for screening for infections with a predominantly sexual mode of transmission: Secondary | ICD-10-CM

## 2013-11-06 DIAGNOSIS — L729 Follicular cyst of the skin and subcutaneous tissue, unspecified: Secondary | ICD-10-CM

## 2013-11-06 DIAGNOSIS — Z1151 Encounter for screening for human papillomavirus (HPV): Secondary | ICD-10-CM | POA: Insufficient documentation

## 2013-11-06 DIAGNOSIS — L723 Sebaceous cyst: Secondary | ICD-10-CM

## 2013-11-06 LAB — CBC WITH DIFFERENTIAL/PLATELET
BASOS PCT: 0 % (ref 0–1)
Basophils Absolute: 0 10*3/uL (ref 0.0–0.1)
EOS PCT: 1 % (ref 0–5)
Eosinophils Absolute: 0.1 10*3/uL (ref 0.0–0.7)
HCT: 43.6 % (ref 36.0–46.0)
HEMOGLOBIN: 14.6 g/dL (ref 12.0–15.0)
LYMPHS ABS: 2.1 10*3/uL (ref 0.7–4.0)
Lymphocytes Relative: 25 % (ref 12–46)
MCH: 31.2 pg (ref 26.0–34.0)
MCHC: 33.5 g/dL (ref 30.0–36.0)
MCV: 93.2 fL (ref 78.0–100.0)
MONO ABS: 0.8 10*3/uL (ref 0.1–1.0)
MONOS PCT: 9 % (ref 3–12)
Neutro Abs: 5.5 10*3/uL (ref 1.7–7.7)
Neutrophils Relative %: 65 % (ref 43–77)
Platelets: 270 10*3/uL (ref 150–400)
RBC: 4.68 MIL/uL (ref 3.87–5.11)
RDW: 13.5 % (ref 11.5–15.5)
WBC: 8.4 10*3/uL (ref 4.0–10.5)

## 2013-11-06 NOTE — Patient Instructions (Addendum)
Office will call you with biopsy results. Follow up if you will need the stitches removed after one to 2 weeks. If they fall out on their own or you are comfortable cutting them out then that is okay.  Call to Schedule your mammogram  Facilities in [place]: 1)  [facility], [place] Phone: [phone] 2)  [facility]. [address] [place] Phone: [phone] 3)  Dr. Isaiah Blakes at [facility] [address] Phone: [phone]     Mammogram A mammogram is an X-ray test to find changes in a woman's breast. You should get a mammogram if:  You are 62 years of age or older  You have risk factors.   Your doctor recommends that you have one.  BEFORE THE TEST  Do not schedule the test the week before your period, especially if your breasts are sore during this time.  On the day of your mammogram:  Wash your breasts and armpits well. After washing, do not put on any deodorant or talcum powder on until after your test.   Eat and drink as you usually do.   Take your medicines as usual.   If you are diabetic and take insulin, make sure you:   Eat before coming for your test.   Take your insulin as usual.   If you cannot keep your appointment, call before the appointment to cancel. Schedule another appointment.  TEST  You will need to undress from the waist up. You will put on a hospital gown.   Your breast will be put on the mammogram machine, and it will press firmly on your breast with a piece of plastic called a compression paddle. This will make your breast flatter so that the machine can X-ray all parts of your breast.   Both breasts will be X-rayed. Each breast will be X-rayed from above and from the side. An X-ray might need to be taken again if the picture is not good enough.   The mammogram will last about 15 to 30 minutes.  AFTER THE TEST Finding out the results of  your test Ask when your test results will be ready. Make sure you get your test results.  Document Released: 05/07/2008 Document Revised: 01/28/2011 Document Reviewed: 05/07/2008 [facility] Patient Information 2012 [place].    You may obtain a copy of any labs that were done today by logging onto MyChart as outlined in the instructions provided with your AVS (after visit summary). The office will not call with normal lab results but certainly if there are any significant abnormalities then we will contact you.   Health Maintenance, Female A healthy lifestyle and preventative care can promote health and wellness.  Maintain regular health, dental, and eye exams.  Eat a healthy diet. Foods like vegetables, fruits, whole grains, low-fat dairy products, and lean protein foods contain the nutrients you need without too many calories. Decrease your intake of foods high in solid fats, added sugars, and salt. Get information about a proper diet from your caregiver, if necessary.  Regular physical exercise is one of the most important things you can do for your health. Most adults should get at least 150 minutes of moderate-intensity exercise (any activity that increases your heart rate and causes you to sweat) each week. In addition, most adults need muscle-strengthening exercises on 2 or more days a week.   Maintain a healthy weight. The body mass  index (BMI) is a screening tool to identify possible weight problems. It provides an estimate of body fat based on height and weight. Your caregiver can help determine your BMI, and can help you achieve or maintain a healthy weight. For adults 20 years and older:  A BMI below 18.5 is considered underweight.  A BMI of 18.5 to 24.9 is normal.  A BMI of 25 to 29.9 is considered overweight.  A BMI of 30 and above is considered obese.  Maintain normal blood lipids and cholesterol by exercising and minimizing your intake of saturated fat. Eat a  balanced diet with plenty of fruits and vegetables. Blood tests for lipids and cholesterol should begin at age 75 and be repeated every 5 years. If your lipid or cholesterol levels are high, you are over 50, or you are a high risk for heart disease, you may need your cholesterol levels checked more frequently.Ongoing high lipid and cholesterol levels should be treated with medicines if diet and exercise are not effective.  If you smoke, find out from your caregiver how to quit. If you do not use tobacco, do not start.  Lung cancer screening is recommended for adults aged 55 80 years who are at high risk for developing lung cancer because of a history of smoking. Yearly low-dose computed tomography (CT) is recommended for people who have at least a 30-pack-year history of smoking and are a current smoker or have quit within the past 15 years. A pack year of smoking is smoking an average of 1 pack of cigarettes a day for 1 year (for example: 1 pack a day for 30 years or 2 packs a day for 15 years). Yearly screening should continue until the smoker has stopped smoking for at least 15 years. Yearly screening should also be stopped for people who develop a health problem that would prevent them from having lung cancer treatment.  If you are pregnant, do not drink alcohol. If you are breastfeeding, be very cautious about drinking alcohol. If you are not pregnant and choose to drink alcohol, do not exceed 1 drink per day. One drink is considered to be 12 ounces (355 mL) of beer, 5 ounces (148 mL) of wine, or 1.5 ounces (44 mL) of liquor.  Avoid use of street drugs. Do not share needles with anyone. Ask for help if you need support or instructions about stopping the use of drugs.  High blood pressure causes heart disease and increases the risk of stroke. Blood pressure should be checked at least every 1 to 2 years. Ongoing high blood pressure should be treated with medicines, if weight loss and exercise are not  effective.  If you are 72 to 44 years old, ask your caregiver if you should take aspirin to prevent strokes.  Diabetes screening involves taking a blood sample to check your fasting blood sugar level. This should be done once every 3 years, after age 53, if you are within normal weight and without risk factors for diabetes. Testing should be considered at a younger age or be carried out more frequently if you are overweight and have at least 1 risk factor for diabetes.  Breast cancer screening is essential preventative care for women. You should practice "breast self-awareness." This means understanding the normal appearance and feel of your breasts and may include breast self-examination. Any changes detected, no matter how small, should be reported to a caregiver. Women in their 32s and 30s should have a clinical breast exam (CBE) by  a caregiver as part of a regular health exam every 1 to 3 years. After age 39, women should have a CBE every year. Starting at age 15, women should consider having a mammogram (breast X-ray) every year. Women who have a family history of breast cancer should talk to their caregiver about genetic screening. Women at a high risk of breast cancer should talk to their caregiver about having an MRI and a mammogram every year.  Breast cancer gene (BRCA)-related cancer risk assessment is recommended for women who have family members with BRCA-related cancers. BRCA-related cancers include breast, ovarian, tubal, and peritoneal cancers. Having family members with these cancers may be associated with an increased risk for harmful changes (mutations) in the breast cancer genes BRCA1 and BRCA2. Results of the assessment will determine the need for genetic counseling and BRCA1 and BRCA2 testing.  The Pap test is a screening test for cervical cancer. Women should have a Pap test starting at age 83. Between ages 38 and 34, Pap tests should be repeated every 2 years. Beginning at age 53,  you should have a Pap test every 3 years as long as the past 3 Pap tests have been normal. If you had a hysterectomy for a problem that was not cancer or a condition that could lead to cancer, then you no longer need Pap tests. If you are between ages 96 and 35, and you have had normal Pap tests going back 10 years, you no longer need Pap tests. If you have had past treatment for cervical cancer or a condition that could lead to cancer, you need Pap tests and screening for cancer for at least 20 years after your treatment. If Pap tests have been discontinued, risk factors (such as a new sexual partner) need to be reassessed to determine if screening should be resumed. Some women have medical problems that increase the chance of getting cervical cancer. In these cases, your caregiver may recommend more frequent screening and Pap tests.  The human papillomavirus (HPV) test is an additional test that may be used for cervical cancer screening. The HPV test looks for the virus that can cause the cell changes on the cervix. The cells collected during the Pap test can be tested for HPV. The HPV test could be used to screen women aged 17 years and older, and should be used in women of any age who have unclear Pap test results. After the age of 83, women should have HPV testing at the same frequency as a Pap test.  Colorectal cancer can be detected and often prevented. Most routine colorectal cancer screening begins at the age of 71 and continues through age 48. However, your caregiver may recommend screening at an earlier age if you have risk factors for colon cancer. On a yearly basis, your caregiver may provide home test kits to check for hidden blood in the stool. Use of a small camera at the end of a tube, to directly examine the colon (sigmoidoscopy or colonoscopy), can detect the earliest forms of colorectal cancer. Talk to your caregiver about this at age 46, when routine screening begins. Direct examination of  the colon should be repeated every 5 to 10 years through age 82, unless early forms of pre-cancerous polyps or small growths are found.  Hepatitis C blood testing is recommended for all people born from [birth year] through 1966 and any individual with known risks for hepatitis C.  Practice safe sex. Use condoms and avoid high-risk sexual practices to  reduce the spread of sexually transmitted infections (STIs). Sexually active women aged 48 and younger should be checked for Chlamydia, which is a common sexually transmitted infection. Older women with new or multiple partners should also be tested for Chlamydia. Testing for other STIs is recommended if you are sexually active and at increased risk.  Osteoporosis is a disease in which the bones lose minerals and strength with aging. This can result in serious bone fractures. The risk of osteoporosis can be identified using a bone density scan. Women ages 55 and over and women at risk for fractures or osteoporosis should discuss screening with their caregivers. Ask your caregiver whether you should be taking a calcium supplement or vitamin D to reduce the rate of osteoporosis.  Menopause can be associated with physical symptoms and risks. Hormone replacement therapy is available to decrease symptoms and risks. You should talk to your caregiver about whether hormone replacement therapy is right for you.  Use sunscreen. Apply sunscreen liberally and repeatedly throughout the day. You should seek shade when your shadow is shorter than you. Protect yourself by wearing long sleeves, pants, a wide-brimmed hat, and sunglasses year round, whenever you are outdoors.  Notify your caregiver of new moles or changes in moles, especially if there is a change in shape or color. Also notify your caregiver if a mole is larger than the size of a pencil eraser.  Stay current with your immunizations. Document Released: 08/24/2010 Document Revised: 06/05/2012 Document Reviewed:  08/24/2010 [facility] Patient Information 2014 [place].

## 2013-11-06 NOTE — Progress Notes (Signed)
[name] [name] 1970/09/19 [id number]        43 y.o.  G3P3003 for annual exam.  Several issues below.  Past medical history,surgical history, problem list, medications, allergies, family history and social history were all reviewed and documented as reviewed in the EPIC chart.  ROS:  12 system ROS performed with pertinent positives and negatives included in the history, assessment and plan.   Additional significant findings :  none   Exam: [name] Vitals:   11/06/13 0820  BP: 120/68  Height: 5' 4.5" (1.638 m)  Weight: 144 lb (65.318 kg)   General appearance:  Normal affect, orientation and appearance. Skin: Grossly normal excepting sebaceous type cyst 7 mm below midline left breast anterior chest wall Physical Exam  Pulmonary/Chest:      HEENT: Without gross lesions.  No cervical or supraclavicular adenopathy. Thyroid normal.  Lungs:  Clear without wheezing, rales or rhonchi Cardiac: RR, without RMG Abdominal:  Soft, nontender, without masses, guarding, rebound, organomegaly or hernia Breasts:  Examined lying and sitting without masses, retractions, discharge or axillary adenopathy. Pelvic:  Ext/BUS/vagina with small classic sebaceous type cyst lower right labia majora  Cervix normal. Pap/HPV. GC/Chlamydia  Uterus anteverted, normal size, shape and contour, midline and mobile nontender   Adnexa  Without masses or tenderness    Anus and perineum  Normal   Rectovaginal  Normal sphincter tone without palpated masses or tenderness.   Procedure: Skin overlying anterior chest wall cyst cleansed with Betadine, infiltrated with 1% lidocaine and the sebaceous cyst was excised in its entirety and sent to pathology. 2 4-0 Vicryl skin sutures were placed to reapproximate the incision. Sterile dressing applied afterwards. Postoperative care instructions reviewed. Specimen sent to pathology   Assessment/Plan:  43 y.o. G3P3003 female for annual exam with regular menses, tubal  sterilization.   1. Small cyst left anterior chest wall. Patient noted that she just noticed it  and she wanted it removed. Removed as noted above. Patient will follow up for pathology results. 2. STD screening. Patient wants STD screening without known exposure. GC/Chlamydia, HIV, hepatitis B, hepatitis C, RPR ordered. 3. Pap smear 2012. Pap/HPV today.  History of cryosurgery 2005 for LGSIL. Normal Pap smears since then. Plan repeat at 3 - 5 year interval assuming this Pap smear is normal per current screening guidelines. 4. Mammography never. Strongly recommended baseline screening mammography. Benefits of early detection reviewed. SBE monthly reviewed. 5. Small right vulvar classic sebaceous cyst.  Not bothersome to the patient. Offered excision versus observation. Patient's comfortable with observation and report if it changes. 6. Wellbutrin. I forgot to ask the patient if she needed a refill of her Wellbutrin during her office visit. My office staff will contact her and if needed then we'll provide for this coming year. She has used it for years with good results. 7. Health maintenance. Baseline CBC comprehensive metabolic panel lipid profile urinalysis TSH ordered with above STD blood work. Follow up for biopsy results otherwise one year, sooner as needed.   Note: This document was prepared with digital dictation and possible smart phrase technology. Any transcriptional errors that result from this process are unintentional.   Anastasio Auerbach MD, 9:16 AM 11/06/2013

## 2013-11-06 NOTE — Addendum Note (Signed)
Addended by: Nelva Nay on: 11/06/2013 09:48 AM   Modules accepted: Orders

## 2013-11-07 LAB — URINALYSIS W MICROSCOPIC + REFLEX CULTURE
BACTERIA UA: NONE SEEN
BILIRUBIN URINE: NEGATIVE
Casts: NONE SEEN
Crystals: NONE SEEN
Glucose, UA: NEGATIVE mg/dL
HGB URINE DIPSTICK: NEGATIVE
Ketones, ur: NEGATIVE mg/dL
Leukocytes, UA: NEGATIVE
Nitrite: NEGATIVE
PROTEIN: NEGATIVE mg/dL
Specific Gravity, Urine: 1.005 (ref 1.005–1.030)
Squamous Epithelial / LPF: NONE SEEN
Urobilinogen, UA: 0.2 mg/dL (ref 0.0–1.0)
pH: 6 (ref 5.0–8.0)

## 2013-11-07 LAB — COMPREHENSIVE METABOLIC PANEL
ALT: 11 U/L (ref 0–35)
AST: 13 U/L (ref 0–37)
Albumin: 4.7 g/dL (ref 3.5–5.2)
Alkaline Phosphatase: 48 U/L (ref 39–117)
BUN: 9 mg/dL (ref 6–23)
CO2: 23 mEq/L (ref 19–32)
Calcium: 9.7 mg/dL (ref 8.4–10.5)
Chloride: 106 mEq/L (ref 96–112)
Creat: 0.72 mg/dL (ref 0.50–1.10)
Glucose, Bld: 79 mg/dL (ref 70–99)
Potassium: 4.6 mEq/L (ref 3.5–5.3)
Sodium: 138 mEq/L (ref 135–145)
TOTAL PROTEIN: 6.8 g/dL (ref 6.0–8.3)
Total Bilirubin: 0.5 mg/dL (ref 0.2–1.2)

## 2013-11-07 LAB — RPR

## 2013-11-07 LAB — GC/CHLAMYDIA PROBE AMP
CT Probe RNA: NEGATIVE
GC Probe RNA: NEGATIVE

## 2013-11-07 LAB — HEPATITIS C ANTIBODY: HCV Ab: NEGATIVE

## 2013-11-07 LAB — HIV ANTIBODY (ROUTINE TESTING W REFLEX): HIV 1&2 Ab, 4th Generation: NONREACTIVE

## 2013-11-07 LAB — LIPID PANEL
CHOLESTEROL: 198 mg/dL (ref 0–200)
HDL: 63 mg/dL (ref >39)
LDL CALC: 118 mg/dL — AB (ref 0–99)
TRIGLYCERIDES: 84 mg/dL (ref <150)
Total CHOL/HDL Ratio: 3.1 Ratio
VLDL: 17 mg/dL (ref 0–40)

## 2013-11-07 LAB — TSH: TSH: 3.015 u[IU]/mL (ref 0.350–4.500)

## 2013-11-07 LAB — HEPATITIS B SURFACE ANTIGEN: Hepatitis B Surface Ag: NEGATIVE

## 2013-11-08 LAB — CYTOLOGY - PAP

## 2013-12-24 ENCOUNTER — Encounter: Payer: Self-pay | Admitting: Gynecology

## 2014-07-02 ENCOUNTER — Other Ambulatory Visit: Payer: Self-pay | Admitting: Gynecology

## 2014-07-02 DIAGNOSIS — Z1231 Encounter for screening mammogram for malignant neoplasm of breast: Secondary | ICD-10-CM

## 2014-07-03 ENCOUNTER — Ambulatory Visit (HOSPITAL_COMMUNITY)
Admission: RE | Admit: 2014-07-03 | Discharge: 2014-07-03 | Disposition: A | Discharge status: Home / Self Care | Payer: 59 | Source: Ambulatory Visit | Attending: Gynecology | Admitting: Gynecology

## 2014-07-03 DIAGNOSIS — Z1231 Encounter for screening mammogram for malignant neoplasm of breast: Secondary | ICD-10-CM | POA: Diagnosis present

## 2014-07-05 ENCOUNTER — Other Ambulatory Visit: Payer: Self-pay | Admitting: Gynecology

## 2014-07-05 DIAGNOSIS — R928 Other abnormal and inconclusive findings on diagnostic imaging of breast: Secondary | ICD-10-CM

## 2014-07-09 ENCOUNTER — Ambulatory Visit
Admission: RE | Admit: 2014-07-09 | Discharge: 2014-07-09 | Disposition: A | Discharge status: Home / Self Care | Payer: 59 | Source: Ambulatory Visit | Attending: Gynecology | Admitting: Gynecology

## 2014-07-09 ENCOUNTER — Other Ambulatory Visit: Payer: Self-pay | Admitting: Gynecology

## 2014-07-09 DIAGNOSIS — R928 Other abnormal and inconclusive findings on diagnostic imaging of breast: Secondary | ICD-10-CM

## 2014-07-11 ENCOUNTER — Other Ambulatory Visit: Payer: Self-pay | Admitting: Gynecology

## 2014-07-11 DIAGNOSIS — R928 Other abnormal and inconclusive findings on diagnostic imaging of breast: Secondary | ICD-10-CM

## 2014-07-24 ENCOUNTER — Ambulatory Visit
Admission: RE | Admit: 2014-07-24 | Discharge: 2014-07-24 | Disposition: A | Discharge status: Home / Self Care | Payer: 59 | Source: Ambulatory Visit | Attending: Gynecology | Admitting: Gynecology

## 2014-07-24 DIAGNOSIS — R928 Other abnormal and inconclusive findings on diagnostic imaging of breast: Secondary | ICD-10-CM

## 2014-10-11 ENCOUNTER — Other Ambulatory Visit: Payer: Self-pay | Admitting: Gynecology

## 2014-11-12 ENCOUNTER — Other Ambulatory Visit: Payer: Self-pay | Admitting: Gynecology

## 2014-11-15 ENCOUNTER — Encounter: Payer: Self-pay | Admitting: Gynecology

## 2014-11-15 ENCOUNTER — Ambulatory Visit (INDEPENDENT_AMBULATORY_CARE_PROVIDER_SITE_OTHER): Payer: 59 | Admitting: Gynecology

## 2014-11-15 ENCOUNTER — Other Ambulatory Visit (HOSPITAL_COMMUNITY)
Admission: RE | Admit: 2014-11-15 | Discharge: 2014-11-15 | Disposition: A | Discharge status: Home / Self Care | Payer: 59 | Source: Ambulatory Visit | Attending: Gynecology | Admitting: Gynecology

## 2014-11-15 VITALS — BP 120/76 | Ht 64.5 in | Wt 154.0 lb

## 2014-11-15 DIAGNOSIS — L723 Sebaceous cyst: Secondary | ICD-10-CM

## 2014-11-15 DIAGNOSIS — Z01419 Encounter for gynecological examination (general) (routine) without abnormal findings: Secondary | ICD-10-CM | POA: Diagnosis not present

## 2014-11-15 LAB — CBC WITH DIFFERENTIAL/PLATELET
Basophils Absolute: 0 10*3/uL (ref 0.0–0.1)
Basophils Relative: 0 % (ref 0–1)
EOS PCT: 2 % (ref 0–5)
Eosinophils Absolute: 0.2 10*3/uL (ref 0.0–0.7)
HEMATOCRIT: 37.9 % (ref 36.0–46.0)
Hemoglobin: 12.4 g/dL (ref 12.0–15.0)
LYMPHS ABS: 3.2 10*3/uL (ref 0.7–4.0)
LYMPHS PCT: 39 % (ref 12–46)
MCH: 30.2 pg (ref 26.0–34.0)
MCHC: 32.7 g/dL (ref 30.0–36.0)
MCV: 92.4 fL (ref 78.0–100.0)
MONOS PCT: 9 % (ref 3–12)
MPV: 11.7 fL (ref 8.6–12.4)
Monocytes Absolute: 0.7 10*3/uL (ref 0.1–1.0)
Neutro Abs: 4.1 10*3/uL (ref 1.7–7.7)
Neutrophils Relative %: 50 % (ref 43–77)
Platelets: 296 10*3/uL (ref 150–400)
RBC: 4.1 MIL/uL (ref 3.87–5.11)
RDW: 13 % (ref 11.5–15.5)
WBC: 8.1 10*3/uL (ref 4.0–10.5)

## 2014-11-15 LAB — COMPREHENSIVE METABOLIC PANEL
ALK PHOS: 55 U/L (ref 33–115)
ALT: 5 U/L — ABNORMAL LOW (ref 6–29)
AST: 12 U/L (ref 10–35)
Albumin: 4.3 g/dL (ref 3.6–5.1)
BUN: 9 mg/dL (ref 7–25)
CALCIUM: 9.2 mg/dL (ref 8.6–10.2)
CO2: 27 mmol/L (ref 20–31)
Chloride: 103 mmol/L (ref 98–110)
Creat: 0.75 mg/dL (ref 0.50–1.10)
Glucose, Bld: 78 mg/dL (ref 65–99)
POTASSIUM: 3.9 mmol/L (ref 3.5–5.3)
Sodium: 136 mmol/L (ref 135–146)
TOTAL PROTEIN: 6.2 g/dL (ref 6.1–8.1)
Total Bilirubin: 0.2 mg/dL (ref 0.2–1.2)

## 2014-11-15 LAB — LIPID PANEL
CHOL/HDL RATIO: 3.3 ratio (ref <=5.0)
CHOLESTEROL: 171 mg/dL (ref 125–200)
HDL: 52 mg/dL (ref >=46)
LDL Cholesterol: 105 mg/dL (ref <130)
TRIGLYCERIDES: 72 mg/dL (ref <150)
VLDL: 14 mg/dL (ref <30)

## 2014-11-15 MED ORDER — BUPROPION HCL ER (XL) 150 MG PO TB24: 150.0000 mg | ORAL_TABLET | Freq: Every day | ORAL | Status: DC

## 2014-11-15 NOTE — Patient Instructions (Signed)

## 2014-11-15 NOTE — Progress Notes (Signed)
[name] [name] 04-26-1970 [id number]        44 y.o.  [id number] for annual exam.  Several issues noted below.  Past medical history,surgical history, problem list, medications, allergies, family history and social history were all reviewed and documented as reviewed in the EPIC chart.  ROS:  Performed with pertinent positives and negatives included in the history, assessment and plan.   Additional significant findings :  none   Exam: [name] [name] Vitals:   11/15/14 1600  BP: 120/76  Height: 5' 4.5" (1.638 m)  Weight: 154 lb (69.854 kg)   General appearance:  Normal affect, orientation and appearance. Skin: Grossly normal with small classic sebaceous cyst lower aspect anterior axillary line. HEENT: Without gross lesions.  No cervical or supraclavicular adenopathy. Thyroid normal.  Lungs:  Clear without wheezing, rales or rhonchi Cardiac: RR, without RMG Abdominal:  Soft, nontender, without masses, guarding, rebound, organomegaly or hernia Breasts:  Examined lying and sitting without masses, retractions, discharge or axillary adenopathy. Pelvic:  Ext/BUS/vagina normal with small classic sebaceous cyst lower left labia majora  Cervix normal. Pap smear done  Uterus anteverted, normal size, shape and contour, midline and mobile nontender   Adnexa  Without masses or tenderness    Anus and perineum  Normal excepting several small classic sebaceous cysts around the external anal verge  Rectovaginal  Normal sphincter tone without palpated masses or tenderness.    Assessment/Plan:  44 y.o. G3P3003 female for annual exam with regular menses, tubal sterilization.   1. Loss of urine during intercourse. Patient notes with intercourse sometimes she will lose some urine. Exam is normal without significant cystocele or other abnormalities. Discussed with patient that it is not too unusual for this and that she may want to try different positions so the bladder is not hit during  intercourse.  2. Pap smears/HPV negative 2015.  No endocervical cells on that Pap smear. I repeated her Pap smear today.  She does have a history of LGSIL with cryosurgery 2005. Normal Pap smears since then. 3. Mammography 06/2013. Continue with annual mammography. SBE monthly reviewed. 4. Sebaceous cysts.  Left axilla, left labia majora and perianal as described above. All very benign and classic in appearance. Not bothersome to the patient. She'll continue to monitor. 5. Stop smoking again discussed and encouraged. 6. Wellbutrin. Patient taking for mood stabilization and doing well on this. Wants to continue. Refill 1 year provided. 7. Health maintenance. Baseline CBC comprehensive metabolic panel lipid profile urinalysis ordered. Follow up in one year, sooner as needed.   [name] Auerbach MD, 4:28 PM 11/15/2014

## 2014-11-15 NOTE — Addendum Note (Signed)
Addended by: Nelva Nay on: 11/15/2014 04:41 PM   Modules accepted: Orders

## 2014-11-16 LAB — URINALYSIS W MICROSCOPIC + REFLEX CULTURE
Bilirubin Urine: NEGATIVE
CASTS: NONE SEEN [LPF]
Crystals: NONE SEEN [HPF]
Glucose, UA: NEGATIVE
Hgb urine dipstick: NEGATIVE
Ketones, ur: NEGATIVE
Leukocytes, UA: NEGATIVE
NITRITE: NEGATIVE
PH: 5.5 (ref 5.0–8.0)
Protein, ur: NEGATIVE
RBC / HPF: NONE SEEN RBC/HPF (ref <=2)
Specific Gravity, Urine: 1.02 (ref 1.001–1.035)
YEAST: NONE SEEN [HPF]

## 2014-11-18 ENCOUNTER — Other Ambulatory Visit: Payer: Self-pay | Admitting: Gynecology

## 2014-11-18 ENCOUNTER — Telehealth: Payer: Self-pay

## 2014-11-18 MED ORDER — SULFAMETHOXAZOLE-TRIMETHOPRIM 800-160 MG PO TABS: 1.0000 | ORAL_TABLET | Freq: Two times a day (BID) | ORAL | Status: DC

## 2014-11-18 MED ORDER — FLUCONAZOLE 150 MG PO TABS: 150.0000 mg | ORAL_TABLET | Freq: Once | ORAL | Status: DC

## 2014-11-18 NOTE — Telephone Encounter (Signed)
okay

## 2014-11-18 NOTE — Telephone Encounter (Signed)
Patient asked to have a Diflucan tablet sent in with the Septra in case it causes a yeast infection. Ok?

## 2014-11-18 NOTE — Telephone Encounter (Signed)
Rx sent 

## 2014-11-18 NOTE — Telephone Encounter (Signed)
-----   Message from Anastasio Auerbach, MD sent at 11/18/2014  8:49 AM EDT ----- Tell patient looks like she has a urinary tract infection. Recommend Septra DS 1 by mouth twice a day 3 days.

## 2014-11-19 LAB — URINE CULTURE: Colony Count: >=100000

## 2014-11-19 LAB — CYTOLOGY - PAP

## 2015-01-21 ENCOUNTER — Encounter: Payer: Self-pay | Admitting: Gynecology

## 2015-11-22 ENCOUNTER — Other Ambulatory Visit: Payer: Self-pay | Admitting: Gynecology

## 2015-11-24 NOTE — Telephone Encounter (Signed)
Okay to refill 1 month

## 2015-11-24 NOTE — Telephone Encounter (Signed)
ANNUAL SCHEDULED ON 12/10/15

## 2015-11-24 NOTE — Telephone Encounter (Signed)
Rx sent 

## 2015-12-10 ENCOUNTER — Encounter: Payer: Self-pay | Admitting: Gynecology

## 2015-12-10 ENCOUNTER — Ambulatory Visit (INDEPENDENT_AMBULATORY_CARE_PROVIDER_SITE_OTHER): Payer: 59 | Admitting: Gynecology

## 2015-12-10 VITALS — BP 120/76 | Ht 65.0 in | Wt 149.0 lb

## 2015-12-10 DIAGNOSIS — Z01411 Encounter for gynecological examination (general) (routine) with abnormal findings: Secondary | ICD-10-CM | POA: Diagnosis not present

## 2015-12-10 DIAGNOSIS — Z13220 Encounter for screening for lipoid disorders: Secondary | ICD-10-CM | POA: Diagnosis not present

## 2015-12-10 DIAGNOSIS — N907 Vulvar cyst: Secondary | ICD-10-CM

## 2015-12-10 LAB — CBC WITH DIFFERENTIAL/PLATELET
BASOS PCT: 0 %
Basophils Absolute: 0 cells/uL (ref 0–200)
EOS PCT: 1 %
Eosinophils Absolute: 89 cells/uL (ref 15–500)
HCT: 39.8 % (ref 35.0–45.0)
Hemoglobin: 13 g/dL (ref 11.7–15.5)
LYMPHS PCT: 26 %
Lymphs Abs: 2314 cells/uL (ref 850–3900)
MCH: 30.3 pg (ref 27.0–33.0)
MCHC: 32.7 g/dL (ref 32.0–36.0)
MCV: 92.8 fL (ref 80.0–100.0)
MONO ABS: 623 {cells}/uL (ref 200–950)
MONOS PCT: 7 %
MPV: 10.8 fL (ref 7.5–12.5)
NEUTROS PCT: 66 %
Neutro Abs: 5874 cells/uL (ref 1500–7800)
PLATELETS: 261 10*3/uL (ref 140–400)
RBC: 4.29 MIL/uL (ref 3.80–5.10)
RDW: 13 % (ref 11.0–15.0)
WBC: 8.9 10*3/uL (ref 3.8–10.8)

## 2015-12-10 LAB — COMPREHENSIVE METABOLIC PANEL
ALT: 4 U/L — AB (ref 6–29)
AST: 11 U/L (ref 10–35)
Albumin: 4.3 g/dL (ref 3.6–5.1)
Alkaline Phosphatase: 50 U/L (ref 33–115)
BUN: 13 mg/dL (ref 7–25)
CO2: 22 mmol/L (ref 20–31)
CREATININE: 1.03 mg/dL (ref 0.50–1.10)
Calcium: 9.2 mg/dL (ref 8.6–10.2)
Chloride: 107 mmol/L (ref 98–110)
Glucose, Bld: 79 mg/dL (ref 65–99)
Potassium: 4.2 mmol/L (ref 3.5–5.3)
SODIUM: 139 mmol/L (ref 135–146)
Total Bilirubin: 0.3 mg/dL (ref 0.2–1.2)
Total Protein: 6.3 g/dL (ref 6.1–8.1)

## 2015-12-10 LAB — LIPID PANEL
CHOL/HDL RATIO: 4.1 ratio (ref <=5.0)
CHOLESTEROL: 180 mg/dL (ref 125–200)
HDL: 44 mg/dL — ABNORMAL LOW (ref >=46)
LDL Cholesterol: 117 mg/dL (ref <130)
TRIGLYCERIDES: 95 mg/dL (ref <150)
VLDL: 19 mg/dL (ref <30)

## 2015-12-10 MED ORDER — BUPROPION HCL ER (XL) 150 MG PO TB24: 150.0000 mg | ORAL_TABLET | Freq: Every day | ORAL | 11 refills | Status: DC

## 2015-12-10 NOTE — Progress Notes (Signed)
    ANGELEE DREIS 1970/04/11 [id number]        45 y.o.  G3P3003  for annual exam.  Several issues noted below.  Past medical history,surgical history, problem list, medications, allergies, family history and social history were all reviewed and documented as reviewed in the EPIC chart.  ROS:  Performed with pertinent positives and negatives included in the history, assessment and plan.   Additional significant findings :  None   Exam: [name] assistant Vitals:   12/10/15 1121  BP: 120/76  Weight: 149 lb (67.6 kg)  Height: 5\' 5"  (1.651 m)   Body mass index is 24.79 kg/m.  General appearance:  Normal affect, orientation and appearance. Skin: Grossly normal HEENT: Without gross lesions.  No cervical or supraclavicular adenopathy. Thyroid normal.  Lungs:  Clear without wheezing, rales or rhonchi Cardiac: RR, without RMG Abdominal:  Soft, nontender, without masses, guarding, rebound, organomegaly or hernia Breasts:  Examined lying and sitting without masses, retractions, discharge or axillary adenopathy. Pelvic:  Ext, BUS, Vagina ith multiple small classic appearing sebaceous cysts lower left labia majora and peri-anal  Cervix normal  Uterus anteverted, normal size, shape and contour, midline and mobile nontender   Adnexa without masses or tenderness    Anus and perineum normal   Rectovaginal normal sphincter tone without palpated masses or tenderness.    Assessment/Plan:  45 y.o. G35P3003 female for annual exam with regular menses, tubal sterilization.   1. Sebaceous cysts left lower labia are bothersome to the patient. She states they become tender and irritated at times. Would like to have these removed. Patient will schedule a separate appointment and we will either lanced them or excise them. 2. Pap smear 2016. Pap smear/HPV 2015 but lacked endocervical cells. No Pap smear done today. 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 3. Overdue for mammography. Patient knows to call and schedule. SBE monthly reviewed. 4. Wellbutrin XL 150 mg. Doing well for mood stabilization. She wants to continue. Refill 1 year provided. 5. Continues to smoke despite repetitive discussions. Not ready to quit. 6. Health maintenance. Baseline CBC, comprehensive metabolic panel, lipid profile, urinalysis ordered. Follow up for the labial cyst excision/IND otherwise annual exam in one year   Anastasio Auerbach MD, 1:15 PM 12/10/2015

## 2015-12-10 NOTE — Patient Instructions (Addendum)
Follow up for vulvar biopsy appointment as scheduled  You may obtain a copy of any labs that were done today by logging onto MyChart as outlined in the instructions provided with your AVS (after visit summary). The office will not call with normal lab results but certainly if there are any significant abnormalities then we will contact you.   Health Maintenance Adopting a healthy lifestyle and getting preventive care can go a long way to promote health and wellness. Talk with your health care provider about what schedule of regular examinations is right for you. This is a good chance for you to check in with your provider about disease prevention and staying healthy. In between checkups, there are plenty of things you can do on your own. Experts have done a lot of research about which lifestyle changes and preventive measures are most likely to keep you healthy. Ask your health care provider for more information. WEIGHT AND DIET  Eat a healthy diet  Be sure to include plenty of vegetables, fruits, low-fat dairy products, and lean protein.  Do not eat a lot of foods high in solid fats, added sugars, or salt.  Get regular exercise. This is one of the most important things you can do for your health.  Most adults should exercise for at least 150 minutes each week. The exercise should increase your heart rate and make you sweat (moderate-intensity exercise).  Most adults should also do strengthening exercises at least twice a week. This is in addition to the moderate-intensity exercise.  Maintain a healthy weight  Body mass index (BMI) is a measurement that can be used to identify possible weight problems. It estimates body fat based on height and weight. Your health care provider can help determine your BMI and help you achieve or maintain a healthy weight.  For females 44 years of age and older:   A BMI below 18.5 is considered underweight.  A BMI of 18.5 to 24.9 is normal.  A BMI of 25  to 29.9 is considered overweight.  A BMI of 30 and above is considered obese.  Watch levels of cholesterol and blood lipids  You should start having your blood tested for lipids and cholesterol at 46 years of age, then have this test every 5 years.  You may need to have your cholesterol levels checked more often if:  Your lipid or cholesterol levels are high.  You are older than 46 years of age.  You are at high risk for heart disease.  CANCER SCREENING   Lung Cancer  Lung cancer screening is recommended for adults 7-69 years old who are at high risk for lung cancer because of a history of smoking.  A yearly low-dose CT scan of the lungs is recommended for people who:  Currently smoke.  Have quit within the past 15 years.  Have at least a 30-pack-year history of smoking. A pack year is smoking an average of one pack of cigarettes a day for 1 year.  Yearly screening should continue until it has been 15 years since you quit.  Yearly screening should stop if you develop a health problem that would prevent you from having lung cancer treatment.  Breast Cancer  Practice breast self-awareness. This means understanding how your breasts normally appear and feel.  It also means doing regular breast self-exams. Let your health care provider know about any changes, no matter how small.  If you are in your 20s or 30s, you should have a clinical breast  exam (CBE) by a health care provider every 1-3 years as part of a regular health exam.  If you are 61 or older, have a CBE every year. Also consider having a breast X-ray (mammogram) every year.  If you have a family history of breast cancer, talk to your health care provider about genetic screening.  If you are at high risk for breast cancer, talk to your health care provider about having an MRI and a mammogram every year.  Breast cancer gene (BRCA) assessment is recommended for women who have family members with BRCA-related  cancers. BRCA-related cancers include:  Breast.  Ovarian.  Tubal.  Peritoneal cancers.  Results of the assessment will determine the need for genetic counseling and BRCA1 and BRCA2 testing. Cervical Cancer Routine pelvic examinations to screen for cervical cancer are no longer recommended for nonpregnant women who are considered low risk for cancer of the pelvic organs (ovaries, uterus, and vagina) and who do not have symptoms. A pelvic examination may be necessary if you have symptoms including those associated with pelvic infections. Ask your health care provider if a screening pelvic exam is right for you.   The Pap test is the screening test for cervical cancer for women who are considered at risk.  If you had a hysterectomy for a problem that was not cancer or a condition that could lead to cancer, then you no longer need Pap tests.  If you are older than 65 years, and you have had normal Pap tests for the past 10 years, you no longer need to have Pap tests.  If you have had past treatment for cervical cancer or a condition that could lead to cancer, you need Pap tests and screening for cancer for at least 20 years after your treatment.  If you no longer get a Pap test, assess your risk factors if they change (such as having a new sexual partner). This can affect whether you should start being screened again.  Some women have medical problems that increase their chance of getting cervical cancer. If this is the case for you, your health care provider may recommend more frequent screening and Pap tests.  The human papillomavirus (HPV) test is another test that may be used for cervical cancer screening. The HPV test looks for the virus that can cause cell changes in the cervix. The cells collected during the Pap test can be tested for HPV.  The HPV test can be used to screen women 64 years of age and older. Getting tested for HPV can extend the interval between normal Pap tests from  three to five years.  An HPV test also should be used to screen women of any age who have unclear Pap test results.  After 46 years of age, women should have HPV testing as often as Pap tests.  Colorectal Cancer  This type of cancer can be detected and often prevented.  Routine colorectal cancer screening usually begins at 46 years of age and continues through 46 years of age.  Your health care provider may recommend screening at an earlier age if you have risk factors for colon cancer.  Your health care provider may also recommend using home test kits to check for hidden blood in the stool.  A small camera at the end of a tube can be used to examine your colon directly (sigmoidoscopy or colonoscopy). This is done to check for the earliest forms of colorectal cancer.  Routine screening usually begins at age 67.  Direct examination of the colon should be repeated every 5-10 years through 46 years of age. However, you may need to be screened more often if early forms of precancerous polyps or small growths are found. Skin Cancer  Check your skin from head to toe regularly.  Tell your health care provider about any new moles or changes in moles, especially if there is a change in a mole's shape or color.  Also tell your health care provider if you have a mole that is larger than the size of a pencil eraser.  Always use sunscreen. Apply sunscreen liberally and repeatedly throughout the day.  Protect yourself by wearing long sleeves, pants, a wide-brimmed hat, and sunglasses whenever you are outside. HEART DISEASE, DIABETES, AND HIGH BLOOD PRESSURE   Have your blood pressure checked at least every 1-2 years. High blood pressure causes heart disease and increases the risk of stroke.  If you are between 55 years and 79 years old, ask your health care provider if you should take aspirin to prevent strokes.  Have regular diabetes screenings. This involves taking a blood sample to check  your fasting blood sugar level.  If you are at a normal weight and have a low risk for diabetes, have this test once every three years after 45 years of age.  If you are overweight and have a high risk for diabetes, consider being tested at a younger age or more often. PREVENTING INFECTION  Hepatitis B  If you have a higher risk for hepatitis B, you should be screened for this virus. You are considered at high risk for hepatitis B if:  You were born in a country where hepatitis B is common. Ask your health care provider which countries are considered high risk.  Your parents were born in a high-risk country, and you have not been immunized against hepatitis B (hepatitis B vaccine).  You have HIV or AIDS.  You use needles to inject street drugs.  You live with someone who has hepatitis B.  You have had sex with someone who has hepatitis B.  You get hemodialysis treatment.  You take certain medicines for conditions, including cancer, organ transplantation, and autoimmune conditions. Hepatitis C  Blood testing is recommended for:  Everyone born from 1945 through 1965.  Anyone with known risk factors for hepatitis C. Sexually transmitted infections (STIs)  You should be screened for sexually transmitted infections (STIs) including gonorrhea and chlamydia if:  You are sexually active and are younger than 46 years of age.  You are older than 46 years of age and your health care provider tells you that you are at risk for this type of infection.  Your sexual activity has changed since you were last screened and you are at an increased risk for chlamydia or gonorrhea. Ask your health care provider if you are at risk.  If you do not have HIV, but are at risk, it may be recommended that you take a prescription medicine daily to prevent HIV infection. This is called pre-exposure prophylaxis (PrEP). You are considered at risk if:  You are sexually active and do not regularly use  condoms or know the HIV status of your partner(s).  You take drugs by injection.  You are sexually active with a partner who has HIV. Talk with your health care provider about whether you are at high risk of being infected with HIV. If you choose to begin PrEP, you should first be tested for HIV. You should then be   tested every 3 months for as long as you are taking PrEP.  PREGNANCY   If you are premenopausal and you may become pregnant, ask your health care provider about preconception counseling.  If you may become pregnant, take 400 to 800 micrograms (mcg) of folic acid every day.  If you want to prevent pregnancy, talk to your health care provider about birth control (contraception). OSTEOPOROSIS AND MENOPAUSE   Osteoporosis is a disease in which the bones lose minerals and strength with aging. This can result in serious bone fractures. Your risk for osteoporosis can be identified using a bone density scan.  If you are 23 years of age or older, or if you are at risk for osteoporosis and fractures, ask your health care provider if you should be screened.  Ask your health care provider whether you should take a calcium or vitamin D supplement to lower your risk for osteoporosis.  Menopause may have certain physical symptoms and risks.  Hormone replacement therapy may reduce some of these symptoms and risks. Talk to your health care provider about whether hormone replacement therapy is right for you.  HOME CARE INSTRUCTIONS   Schedule regular health, dental, and eye exams.  Stay current with your immunizations.   Do not use any tobacco products including cigarettes, chewing tobacco, or electronic cigarettes.  If you are pregnant, do not drink alcohol.  If you are breastfeeding, limit how much and how often you drink alcohol.  Limit alcohol intake to no more than 1 drink per day for nonpregnant women. One drink equals 12 ounces of beer, 5 ounces of wine, or 1 ounces of hard  liquor.  Do not use street drugs.  Do not share needles.  Ask your health care provider for help if you need support or information about quitting drugs.  Tell your health care provider if you often feel depressed.  Tell your health care provider if you have ever been abused or do not feel safe at home. Document Released: 08/24/2010 Document Revised: 06/25/2013 Document Reviewed: 01/10/2013 [facility] Patient Information 2015 [place], [place]. This information is not intended to replace advice given to you by your health care provider. Make sure you discuss any questions you have with your health care provider.

## 2015-12-11 LAB — URINALYSIS W MICROSCOPIC + REFLEX CULTURE
BILIRUBIN URINE: NEGATIVE
CASTS: NONE SEEN [LPF]
Crystals: NONE SEEN [HPF]
GLUCOSE, UA: NEGATIVE
Hgb urine dipstick: NEGATIVE
KETONES UR: NEGATIVE
LEUKOCYTES UA: NEGATIVE
Nitrite: NEGATIVE
PH: 6 (ref 5.0–8.0)
PROTEIN: NEGATIVE
SPECIFIC GRAVITY, URINE: 1.02 (ref 1.001–1.035)
Yeast: NONE SEEN [HPF]

## 2015-12-12 LAB — URINE CULTURE

## 2015-12-16 ENCOUNTER — Encounter: Payer: Self-pay | Admitting: Gynecology

## 2015-12-16 ENCOUNTER — Ambulatory Visit (INDEPENDENT_AMBULATORY_CARE_PROVIDER_SITE_OTHER): Payer: 59 | Admitting: Gynecology

## 2015-12-16 VITALS — BP 110/74

## 2015-12-16 DIAGNOSIS — N907 Vulvar cyst: Secondary | ICD-10-CM

## 2015-12-16 NOTE — Progress Notes (Signed)
    [name] 1970-08-28 [id number]        45 y.o.  G3P3003 presents to have her vulvar sebaceous cysts excised.  Recently seen at her annual exam with multiple small vulvar/perianal sebaceous cysts that are bothersome to her that she wants to have removed. I discussed 2 options with her to include I&D of each cyst with the risk of recurrence versus excising each one which would be a more extensive procedure requiring sutures. Patient is comfortable with the I&D and will follow up if she has any recurrences.   Past medical history,surgical history, problem list, medications, allergies, family history and social history were all reviewed and documented in the EPIC chart.  Directed ROS with pertinent positives and negatives documented in the history of present illness/assessment and plan.  Exam: [name] Vitals:   12/16/15 1134  BP: 110/74   General appearance:  Normal External BUS vagina with 6 classic sebaceous cysts as diagrammed below. The skin overlying all of the 6 cystic areas were cleansed with Betadine and using an 18-gauge needle the pointing areas of the skin were incised to make a small opening and the sebaceous material was extruded completely from each cyst leaving no apparent residual content. No active bleeding was noted at the end of the procedure. The patient tolerated it well.  Physical Exam  Genitourinary:         Assessment/Plan:  45 y.o. [id number] with multiple small classic appearing sebaceous cysts of the vulvar and perianal area. All areas were incised and drained as above. No residual areas were left. Postoperative care discussed with the patient. She'll follow up if she has any issues otherwise follow up 1 year when she is due for her annual exam.    Anastasio Auerbach MD, 11:50 AM 12/16/2015

## 2015-12-16 NOTE — Patient Instructions (Signed)
Follow up if any issues otherwise follow up in one year for annual exam

## 2015-12-29 ENCOUNTER — Other Ambulatory Visit: Payer: Self-pay | Admitting: Gynecology

## 2016-12-10 ENCOUNTER — Encounter: Payer: 59 | Admitting: Gynecology

## 2016-12-13 ENCOUNTER — Other Ambulatory Visit: Payer: Self-pay | Admitting: Gynecology

## 2016-12-16 ENCOUNTER — Encounter: Payer: Self-pay | Admitting: Gynecology

## 2016-12-16 ENCOUNTER — Encounter: Payer: 59 | Admitting: Gynecology

## 2016-12-16 ENCOUNTER — Ambulatory Visit (INDEPENDENT_AMBULATORY_CARE_PROVIDER_SITE_OTHER): Payer: 59 | Admitting: Gynecology

## 2016-12-16 VITALS — BP 116/74 | Ht 64.0 in | Wt 150.0 lb

## 2016-12-16 DIAGNOSIS — Z01419 Encounter for gynecological examination (general) (routine) without abnormal findings: Secondary | ICD-10-CM

## 2016-12-16 DIAGNOSIS — Z13220 Encounter for screening for lipoid disorders: Secondary | ICD-10-CM | POA: Diagnosis not present

## 2016-12-16 LAB — COMPREHENSIVE METABOLIC PANEL
AG Ratio: 2 (calc) (ref 1.0–2.5)
ALBUMIN MSPROF: 4.5 g/dL (ref 3.6–5.1)
ALKALINE PHOSPHATASE (APISO): 54 U/L (ref 33–115)
ALT: 3 U/L — ABNORMAL LOW (ref 6–29)
AST: 12 U/L (ref 10–35)
BUN: 13 mg/dL (ref 7–25)
CHLORIDE: 99 mmol/L (ref 98–110)
CO2: 27 mmol/L (ref 20–32)
CREATININE: 0.95 mg/dL (ref 0.50–1.10)
Calcium: 9.5 mg/dL (ref 8.6–10.2)
GLOBULIN: 2.2 g/dL (ref 1.9–3.7)
Glucose, Bld: 84 mg/dL (ref 65–99)
POTASSIUM: 4.4 mmol/L (ref 3.5–5.3)
SODIUM: 133 mmol/L — AB (ref 135–146)
TOTAL PROTEIN: 6.7 g/dL (ref 6.1–8.1)
Total Bilirubin: 0.3 mg/dL (ref 0.2–1.2)

## 2016-12-16 LAB — CBC WITH DIFFERENTIAL/PLATELET
BASOS PCT: 0.5 %
Basophils Absolute: 42 cells/uL (ref 0–200)
EOS PCT: 1.7 %
Eosinophils Absolute: 141 cells/uL (ref 15–500)
HCT: 37.2 % (ref 35.0–45.0)
HEMOGLOBIN: 12.7 g/dL (ref 11.7–15.5)
Lymphs Abs: 2457 cells/uL (ref 850–3900)
MCH: 30.6 pg (ref 27.0–33.0)
MCHC: 34.1 g/dL (ref 32.0–36.0)
MCV: 89.6 fL (ref 80.0–100.0)
MPV: 12.3 fL (ref 7.5–12.5)
Monocytes Relative: 9 %
NEUTROS ABS: 4914 {cells}/uL (ref 1500–7800)
Neutrophils Relative %: 59.2 %
PLATELETS: 279 10*3/uL (ref 140–400)
RBC: 4.15 10*6/uL (ref 3.80–5.10)
RDW: 11.8 % (ref 11.0–15.0)
Total Lymphocyte: 29.6 %
WBC mixed population: 747 cells/uL (ref 200–950)
WBC: 8.3 10*3/uL (ref 3.8–10.8)

## 2016-12-16 LAB — LIPID PANEL
CHOL/HDL RATIO: 3.5 (calc) (ref <5.0)
CHOLESTEROL: 185 mg/dL (ref <200)
HDL: 53 mg/dL (ref >50)
LDL Cholesterol (Calc): 115 mg/dL (calc) — ABNORMAL HIGH
NON-HDL CHOLESTEROL (CALC): 132 mg/dL — AB (ref <130)
Triglycerides: 77 mg/dL (ref <150)

## 2016-12-16 MED ORDER — BUPROPION HCL ER (XL) 150 MG PO TB24: ORAL_TABLET | ORAL | 11 refills | Status: DC

## 2016-12-16 NOTE — Progress Notes (Signed)
    [name] December 12, 1970 [id number]        46 y.o.  [id number] for annual gynecologic exam.  Note small bump left shoulder that she wanted me to take a look at.  It is not causing her any pain or problems but just noticed it while bathing.  Past medical history,surgical history, problem list, medications, allergies, family history and social history were all reviewed and documented as reviewed in the EPIC chart.  ROS:  Performed with pertinent positives and negatives included in the history, assessment and plan.   Additional significant findings : None   Exam: [name] assistant Vitals:   12/16/16 1552  BP: 116/74  Weight: 150 lb (68 kg)  Height: 5\' 4"  (1.626 m)   Body mass index is 25.75 kg/m.  General appearance:  Normal affect, orientation and appearance. Skin: Grossly normal excepting small sebaceous cyst left mid shoulder. HEENT: Without gross lesions.  No cervical or supraclavicular adenopathy. Thyroid normal.  Lungs:  Clear without wheezing, rales or rhonchi Cardiac: RR, without RMG Abdominal:  Soft, nontender, without masses, guarding, rebound, organomegaly or hernia Breasts:  Examined lying and sitting without masses, retractions, discharge or axillary adenopathy. Pelvic:  Ext, BUS, Vagina: Normal  Cervix: Normal  Uterus: Anteverted, normal size, shape and contour, midline and mobile nontender   Adnexa: Without masses or tenderness    Anus and perineum: Normal   Rectovaginal: Normal sphincter tone without palpated masses or tenderness.    Assessment/Plan:  46 y.o. G2P3003 female for annual gynecologic exam regular menses, tubal sterilization.   1. Sebaceous cyst left shoulder.  Not bothersome to the patient.  Patient will follow and if enlarges or changes she will call and I will refer to general surgery to have excised.  Otherwise patient will follow for now at her choice. 2. Pap smear 10/2014.  No Pap smear done today.  Pap smear/HPV 2015 was negative but lacked  endocervical cells.  History of LGSIL with cryosurgery 2005 with normal Pap smears afterwards.  Plan repeat Pap smear next year at 3-year interval from her last Pap smear per current screening guidelines. 3. Wellbutrin 150 mg XL  for mood stabilization.  Has done so for years with good results.  Refill times 1 year provided. 4. Mammography overdue and patient agrees to call to schedule.  Last mammogram 2016.  Breast exam normal today.  SBE monthly reviewed. 5. Health maintenance.  CBC, CMP, lipid profile, urine analysis ordered.  Follow-up 1 year, sooner as needed.    Anastasio Auerbach MD, 4:44 PM 12/16/2016

## 2016-12-16 NOTE — Patient Instructions (Signed)
Call to Schedule your mammogram  Facilities in [place]: 1)  [facility]. [facility], [address] Phone: [phone] 2)  Dr. Bertrand [name]  [address] Phone: [phone]     Mammogram A mammogram is an X-ray test to find changes in a woman's breast. You should get a mammogram if:  You are 47 years of age or older  You have risk factors.   Your doctor recommends that you have one.  BEFORE THE TEST  Do not schedule the test the week before your period, especially if your breasts are sore during this time.  On the day of your mammogram:  Wash your breasts and armpits well. After washing, do not put on any deodorant or talcum powder on until after your test.   Eat and drink as you usually do.   Take your medicines as usual.   If you are diabetic and take insulin, make sure you:   Eat before coming for your test.   Take your insulin as usual.   If you cannot keep your appointment, call before the appointment to cancel. Schedule another appointment.  TEST  You will need to undress from the waist up. You will put on a hospital gown.   Your breast will be put on the mammogram machine, and it will press firmly on your breast with a piece of plastic called a compression paddle. This will make your breast flatter so that the machine can X-ray all parts of your breast.   Both breasts will be X-rayed. Each breast will be X-rayed from above and from the side. An X-ray might need to be taken again if the picture is not good enough.   The mammogram will last about 15 to 30 minutes.  AFTER THE TEST Finding out the results of your test Ask when your test results will be ready. Make sure you get your test results.  Document Released: 05/07/2008 Document Revised: 01/28/2011 Document Reviewed: 05/07/2008 [employer] Patient Information 2012 [employer].   

## 2016-12-19 LAB — TEST AUTHORIZATION

## 2016-12-19 LAB — URINALYSIS W MICROSCOPIC + REFLEX CULTURE
Bilirubin Urine: NEGATIVE
Glucose, UA: NEGATIVE
Hyaline Cast: NONE SEEN /LPF
KETONES UR: NEGATIVE
LEUKOCYTE ESTERASE: NEGATIVE
NITRITES URINE, INITIAL: NEGATIVE
PROTEIN: NEGATIVE
Specific Gravity, Urine: 1.021 (ref 1.001–1.03)
WBC, UA: NONE SEEN /HPF (ref 0–5)
pH: 6.5 (ref 5.0–8.0)

## 2016-12-19 LAB — URINE CULTURE
MICRO NUMBER:: 81202493
SPECIMEN QUALITY:: ADEQUATE

## 2016-12-19 LAB — NO CULTURE INDICATED

## 2017-01-14 ENCOUNTER — Other Ambulatory Visit: Payer: Self-pay | Admitting: Gynecology

## 2017-05-24 DIAGNOSIS — Z0289 Encounter for other administrative examinations: Secondary | ICD-10-CM

## 2017-11-01 ENCOUNTER — Other Ambulatory Visit: Payer: Self-pay | Admitting: Gynecology

## 2017-11-01 DIAGNOSIS — Z1231 Encounter for screening mammogram for malignant neoplasm of breast: Secondary | ICD-10-CM

## 2017-11-14 ENCOUNTER — Ambulatory Visit
Admission: RE | Admit: 2017-11-14 | Discharge: 2017-11-14 | Disposition: A | Discharge status: Home / Self Care | Payer: 59 | Source: Ambulatory Visit | Attending: Gynecology | Admitting: Gynecology

## 2017-11-14 DIAGNOSIS — Z1231 Encounter for screening mammogram for malignant neoplasm of breast: Secondary | ICD-10-CM

## 2017-12-19 ENCOUNTER — Encounter: Payer: Self-pay | Admitting: Gynecology

## 2017-12-19 ENCOUNTER — Ambulatory Visit (INDEPENDENT_AMBULATORY_CARE_PROVIDER_SITE_OTHER): Payer: 59 | Admitting: Gynecology

## 2017-12-19 VITALS — BP 116/74 | Ht 64.5 in | Wt 158.0 lb

## 2017-12-19 DIAGNOSIS — N898 Other specified noninflammatory disorders of vagina: Secondary | ICD-10-CM | POA: Diagnosis not present

## 2017-12-19 DIAGNOSIS — Z01411 Encounter for gynecological examination (general) (routine) with abnormal findings: Secondary | ICD-10-CM

## 2017-12-19 DIAGNOSIS — Z1151 Encounter for screening for human papillomavirus (HPV): Secondary | ICD-10-CM

## 2017-12-19 DIAGNOSIS — N907 Vulvar cyst: Secondary | ICD-10-CM

## 2017-12-19 LAB — WET PREP FOR TRICH, YEAST, CLUE

## 2017-12-19 MED ORDER — BUPROPION HCL ER (XL) 150 MG PO TB24: ORAL_TABLET | ORAL | 4 refills | Status: DC

## 2017-12-19 NOTE — Addendum Note (Signed)
Addended by: Nelva Nay on: 12/19/2017 04:41 PM   Modules accepted: Orders

## 2017-12-19 NOTE — Progress Notes (Signed)
    [name] January 24, 1971 [id number]        47 y.o.  G3P3003 for annual gynecologic exam.  Still notes of the last several weeks a slight discharge with irritation.  No odor.  No urinary symptoms such as frequency dysuria urgency low back pain fever or chills.  Also has a small vulvar sebaceous cyst that is somewhat irritating.  Past medical history,surgical history, problem list, medications, allergies, family history and social history were all reviewed and documented as reviewed in the EPIC chart.  ROS:  Performed with pertinent positives and negatives included in the history, assessment and plan.   Additional significant findings : None   Exam: [name] assistant Vitals:   12/19/17 1603  BP: 116/74  Weight: 158 lb (71.7 kg)  Height: 5' 4.5" (1.638 m)   Body mass index is 26.7 kg/m.  General appearance:  Normal affect, orientation and appearance. Skin: Grossly normal HEENT: Without gross lesions.  No cervical or supraclavicular adenopathy. Thyroid normal.  Lungs:  Clear without wheezing, rales or rhonchi Cardiac: RR, without RMG Abdominal:  Soft, nontender, without masses, guarding, rebound, organomegaly or hernia Breasts:  Examined lying and sitting without masses, retractions, discharge or axillary adenopathy. Pelvic:  Ext, BUS, Vagina: With small classic sebaceous cyst to left lower labia majora.  Drained completely with pressure.  Slight white discharge noted.  Cervix: Normal.  Pap smear/HPV  Uterus: Anteverted, normal size, shape and contour, midline and mobile nontender   Adnexa: Without masses or tenderness    Anus and perineum: Normal   Rectovaginal: Normal sphincter tone without palpated masses or tenderness.    Assessment/Plan:  47 y.o. G46P3003 female for annual gynecologic exam with regular menses, tubal sterilization.   1. Small sebaceous cyst left lower labia majora, drained.  Follow-up if becomes an issue. 2. Vaginal discharge with irritation.  Wet prep  was negative.  At this point the patient's can a monitor over the next week.  If her symptoms continue she will call.  If they resolve then will follow expectantly. 3. Pap smear 2016.  Pap smear done today.  History of LGSIL with cryosurgery 2005 with normal Pap smears since. 4. Wellbutrin 150 mg XL for mood stabilization.  Has done well with this without side effects.  Prefers to continue now.  Refill x1 year provided. 5. Mammography 10/2017.  Continue with annual mammography next year when due.  Breast exam normal today. 6. Health maintenance.  Patient reports routine lab work done at her primary physician's office.  Follow-up in 1 year, sooner as needed.   [name] Auerbach MD, 4:25 PM 12/19/2017

## 2017-12-19 NOTE — Patient Instructions (Signed)
Call if the vaginal irritation continues.

## 2017-12-20 LAB — PAP IG AND HPV HIGH-RISK: HPV DNA HIGH RISK: NOT DETECTED

## 2018-11-15 ENCOUNTER — Encounter: Payer: Self-pay | Admitting: Gynecology

## 2018-12-20 ENCOUNTER — Other Ambulatory Visit: Payer: Self-pay

## 2018-12-21 ENCOUNTER — Encounter: Payer: Self-pay | Admitting: Gynecology

## 2018-12-21 ENCOUNTER — Ambulatory Visit (INDEPENDENT_AMBULATORY_CARE_PROVIDER_SITE_OTHER): Payer: 59 | Admitting: Gynecology

## 2018-12-21 VITALS — BP 118/76 | Ht 64.0 in | Wt 162.0 lb

## 2018-12-21 DIAGNOSIS — Z01419 Encounter for gynecological examination (general) (routine) without abnormal findings: Secondary | ICD-10-CM

## 2018-12-21 NOTE — Patient Instructions (Signed)
Follow-up in 1 year for annual exam, sooner as needed. 

## 2018-12-21 NOTE — Progress Notes (Signed)
    [name] [name] 1970-08-11 [id number]        48 y.o.  [id number] for annual gynecologic exam.  Doing well without gynecologic complaints.  Past medical history,surgical history, problem list, medications, allergies, family history and social history were all reviewed and documented as reviewed in the EPIC chart.  ROS:  Performed with pertinent positives and negatives included in the history, assessment and plan.   Additional significant findings : None   Exam: [name] assistant Vitals:   12/21/18 1543  BP: 118/76  Weight: 162 lb (73.5 kg)  Height: 5\' 4"  (1.626 m)   Body mass index is 27.81 kg/m.  General appearance:  Normal affect, orientation and appearance. Skin: Grossly normal HEENT: Without gross lesions.  No cervical or supraclavicular adenopathy. Thyroid normal.  Lungs:  Clear without wheezing, rales or rhonchi Cardiac: RR, without RMG Abdominal:  Soft, nontender, without masses, guarding, rebound, organomegaly or hernia Breasts:  Examined lying and sitting without masses, retractions, discharge or axillary adenopathy. Pelvic:  Ext, BUS, Vagina: Normal  Cervix: Normal  Uterus: Anteverted, normal size, shape and contour, midline and mobile nontender   Adnexa: Without masses or tenderness    Anus and perineum: Normal   Rectovaginal: Normal sphincter tone without palpated masses or tenderness.    Assessment/Plan:  49 y.o. G6P3003 female for annual gynecologic exam.  With regular menses, tubal sterilization  1. Mammography due now and I reminded her to schedule this.  Breast exam normal today. 2. Discussed scheduling colonoscopy this coming year as she is turning 21 and she is going to make arrangements for this. 3. Pap smear 2019.  No Pap smear done today.  History of LGSIL with cryosurgery 2005 with normal Pap smears since. 4. Wellbutrin XL 150 mg for mood stabilization.  Doing well on this.  Refill x1 year provided. 5. Health maintenance.  No routine lab work done  as patient does this elsewhere.  Follow-up 1 year, sooner as needed.   Anastasio Auerbach MD, 4:16 PM 12/21/2018

## 2019-01-11 ENCOUNTER — Other Ambulatory Visit: Payer: Self-pay | Admitting: Gynecology

## 2019-12-27 ENCOUNTER — Encounter: Payer: 59 | Admitting: Obstetrics & Gynecology

## 2020-02-05 ENCOUNTER — Other Ambulatory Visit: Payer: Self-pay

## 2020-02-05 MED ORDER — BUPROPION HCL ER (XL) 150 MG PO TB24: ORAL_TABLET | ORAL | 0 refills | Status: DC

## 2020-02-05 NOTE — Telephone Encounter (Signed)
Past due for annual exam. Former TF patient. Has CE scheduled with ML for 02/28/19. Refilled #[phone] until visit.

## 2020-02-20 ENCOUNTER — Encounter: Payer: Self-pay | Admitting: Obstetrics & Gynecology

## 2020-02-20 ENCOUNTER — Other Ambulatory Visit: Payer: Self-pay

## 2020-02-20 ENCOUNTER — Ambulatory Visit (INDEPENDENT_AMBULATORY_CARE_PROVIDER_SITE_OTHER): Payer: 59 | Admitting: Obstetrics & Gynecology

## 2020-02-20 VITALS — BP 124/80 | Ht 64.0 in | Wt 164.8 lb

## 2020-02-20 DIAGNOSIS — Z9851 Tubal ligation status: Secondary | ICD-10-CM

## 2020-02-20 DIAGNOSIS — Z01419 Encounter for gynecological examination (general) (routine) without abnormal findings: Secondary | ICD-10-CM | POA: Diagnosis not present

## 2020-02-20 DIAGNOSIS — E663 Overweight: Secondary | ICD-10-CM

## 2020-02-20 DIAGNOSIS — F320 Major depressive disorder, single episode, mild: Secondary | ICD-10-CM | POA: Diagnosis not present

## 2020-02-20 MED ORDER — BUPROPION HCL ER (XL) 150 MG PO TB24: ORAL_TABLET | ORAL | 12 refills | Status: DC

## 2020-02-20 NOTE — Progress Notes (Signed)
[name] [name] [birth year] [id number]   History:    49 y.o. G3P3L3  Divorced x 6 yrs. S/P TL.  RP:  Established patient presenting for annual gyn exam   HPI: Menses regular normal every month.  No BTB. No pelvic pain. No pain with IC.  S/P TL. Last Pap test normal in 2019.  Had Cryotherapy in 2005. Breasts normal.  Will schedule screening mammo.  BMI 28.29.  Stable Depression on Wellbutrin XL.  Fasting health labs here today.   Will schedule her first Colono now.  Past medical history,surgical history, family history and social history were all reviewed and documented in the EPIC chart.  Gynecologic History Patient's last menstrual period was 01/15/2020.  Obstetric History OB History  Gravida Para Term Preterm AB Living  3 3 3     3   SAB IAB Ectopic Multiple Live Births               # Outcome Date GA Lbr Len/2nd Weight Sex Delivery Anes PTL Lv  3 Term           2 Term           1 Term              ROS: A ROS was performed and pertinent positives and negatives are included in the history.  GENERAL: No fevers or chills. HEENT: No change in vision, no earache, sore throat or sinus congestion. NECK: No pain or stiffness. CARDIOVASCULAR: No chest pain or pressure. No palpitations. PULMONARY: No shortness of breath, cough or wheeze. GASTROINTESTINAL: No abdominal pain, nausea, vomiting or diarrhea, melena or bright red blood per rectum. GENITOURINARY: No urinary frequency, urgency, hesitancy or dysuria. MUSCULOSKELETAL: No joint or muscle pain, no back pain, no recent trauma. DERMATOLOGIC: No rash, no itching, no lesions. ENDOCRINE: No polyuria, polydipsia, no heat or cold intolerance. No recent change in weight. HEMATOLOGICAL: No anemia or easy bruising or bleeding. NEUROLOGIC: No headache, seizures, numbness, tingling or weakness. PSYCHIATRIC: No depression, no loss of interest in normal activity or change in sleep pattern.     Exam:   BP 124/80   Ht 5' 4"  (1.626 m)   Wt 164 lb  12.8 oz (74.8 kg)   LMP 01/15/2020 Comment: tubal ligation   BMI 28.29 kg/m   Body mass index is 28.29 kg/m.  General appearance : Well developed well nourished female. No acute distress HEENT: Eyes: no retinal hemorrhage or exudates,  Neck supple, trachea midline, no carotid bruits, no thyroidmegaly Lungs: Clear to auscultation, no rhonchi or wheezes, or rib retractions  Heart: Regular rate and rhythm, no murmurs or gallops Breast:Examined in sitting and supine position were symmetrical in appearance, no palpable masses or tenderness,  no skin retraction, no nipple inversion, no nipple discharge, no skin discoloration, no axillary or supraclavicular lymphadenopathy Abdomen: no palpable masses or tenderness, no rebound or guarding Extremities: no edema or skin discoloration or tenderness  Pelvic: Vulva: Normal             Vagina: No gross lesions or discharge  Cervix: No gross lesions or discharge.  Pap reflex done.  Uterus  AV, normal size, shape and consistency, non-tender and mobile  Adnexa  Without masses or tenderness  Anus: Normal   Assessment/Plan:  49 y.o. female for annual exam   1. Encounter for routine gynecological examination with Papanicolaou smear of cervix Normal gynecologic exam.  Pap reflex done.  Breast exam normal.  Will schedule a screening mammogram  now.  Will schedule colonoscopy now.  Fasting health labs here today. - CBC - Comp Met (CMET) - TSH - VITAMIN D 25 Hydroxy (Vit-D Deficiency, Fractures) - Lipid panel  2. S/P tubal ligation  3. Overweight (BMI 25.0-29.9) We will start on a lower calorie/carb diet.  Aerobic activities 5 times a week and light weightlifting every 2 days.  4. Current mild episode of major depressive disorder, unspecified whether recurrent ([place]) Well on the Wellbutrin XL.  No contraindication to continue.  Prescription sent to pharmacy.  Other orders - ibuprofen (ADVIL) 400 MG tablet; Take 400 mg by mouth every 6 (six) hours as  needed. - buPROPion (WELLBUTRIN XL) 150 MG 24 hr tablet; Take one tab po daily.  Princess Bruins MD, 10:27 AM 02/20/2020

## 2020-02-21 LAB — COMPREHENSIVE METABOLIC PANEL
AG Ratio: 2 (calc) (ref 1.0–2.5)
ALT: 4 U/L — ABNORMAL LOW (ref 6–29)
AST: 12 U/L (ref 10–35)
Albumin: 4.4 g/dL (ref 3.6–5.1)
Alkaline phosphatase (APISO): 63 U/L (ref 37–153)
BUN: 10 mg/dL (ref 7–25)
CO2: 25 mmol/L (ref 20–32)
Calcium: 9.6 mg/dL (ref 8.6–10.4)
Chloride: 104 mmol/L (ref 98–110)
Creat: 0.85 mg/dL (ref 0.50–1.05)
Globulin: 2.2 g/dL (calc) (ref 1.9–3.7)
Glucose, Bld: 86 mg/dL (ref 65–99)
Potassium: 4.5 mmol/L (ref 3.5–5.3)
Sodium: 137 mmol/L (ref 135–146)
Total Bilirubin: 0.4 mg/dL (ref 0.2–1.2)
Total Protein: 6.6 g/dL (ref 6.1–8.1)

## 2020-02-21 LAB — CBC
HCT: 39.3 % (ref 35.0–45.0)
Hemoglobin: 13.1 g/dL (ref 11.7–15.5)
MCH: 30.3 pg (ref 27.0–33.0)
MCHC: 33.3 g/dL (ref 32.0–36.0)
MCV: 91 fL (ref 80.0–100.0)
MPV: 11.9 fL (ref 7.5–12.5)
Platelets: 287 10*3/uL (ref 140–400)
RBC: 4.32 10*6/uL (ref 3.80–5.10)
RDW: 12.2 % (ref 11.0–15.0)
WBC: 5.6 10*3/uL (ref 3.8–10.8)

## 2020-02-21 LAB — LIPID PANEL
Cholesterol: 199 mg/dL (ref <200)
HDL: 64 mg/dL (ref >=50)
LDL Cholesterol (Calc): 121 mg/dL (calc) — ABNORMAL HIGH
Non-HDL Cholesterol (Calc): 135 mg/dL (calc) — ABNORMAL HIGH (ref <130)
Total CHOL/HDL Ratio: 3.1 (calc) (ref <5.0)
Triglycerides: 53 mg/dL (ref <150)

## 2020-02-21 LAB — VITAMIN D 25 HYDROXY (VIT D DEFICIENCY, FRACTURES): Vit D, 25-Hydroxy: 26 ng/mL — ABNORMAL LOW (ref 30–100)

## 2020-02-21 LAB — PAP IG W/ RFLX HPV ASCU

## 2020-02-21 LAB — TSH: TSH: 3.08 mIU/L

## 2020-02-28 ENCOUNTER — Encounter: Payer: 59 | Admitting: Obstetrics & Gynecology

## 2020-03-19 NOTE — Telephone Encounter (Signed)
I spoke with patient regarding unread message about lab results/recommendations.  Informed. She said had already restarted Vit D3 2000 iu's daily.

## 2020-04-04 ENCOUNTER — Other Ambulatory Visit (HOSPITAL_BASED_OUTPATIENT_CLINIC_OR_DEPARTMENT_OTHER): Payer: Self-pay | Admitting: Family

## 2020-04-04 DIAGNOSIS — Z1231 Encounter for screening mammogram for malignant neoplasm of breast: Secondary | ICD-10-CM

## 2020-04-08 ENCOUNTER — Other Ambulatory Visit: Payer: Self-pay

## 2020-04-08 ENCOUNTER — Ambulatory Visit (HOSPITAL_BASED_OUTPATIENT_CLINIC_OR_DEPARTMENT_OTHER)
Admission: RE | Admit: 2020-04-08 | Discharge: 2020-04-08 | Disposition: A | Discharge status: Home / Self Care | Payer: 59 | Source: Ambulatory Visit | Attending: Family | Admitting: Family

## 2020-04-08 ENCOUNTER — Encounter (HOSPITAL_BASED_OUTPATIENT_CLINIC_OR_DEPARTMENT_OTHER): Payer: Self-pay

## 2020-04-08 DIAGNOSIS — Z1231 Encounter for screening mammogram for malignant neoplasm of breast: Secondary | ICD-10-CM | POA: Diagnosis not present

## 2021-02-26 ENCOUNTER — Encounter: Payer: Self-pay | Admitting: Obstetrics & Gynecology

## 2021-02-26 ENCOUNTER — Ambulatory Visit (INDEPENDENT_AMBULATORY_CARE_PROVIDER_SITE_OTHER): Payer: 59 | Admitting: Obstetrics & Gynecology

## 2021-02-26 ENCOUNTER — Other Ambulatory Visit: Payer: Self-pay

## 2021-02-26 VITALS — BP 120/76 | HR 76 | Resp 16 | Ht 64.25 in | Wt 152.0 lb

## 2021-02-26 DIAGNOSIS — Z01419 Encounter for gynecological examination (general) (routine) without abnormal findings: Secondary | ICD-10-CM | POA: Diagnosis not present

## 2021-02-26 DIAGNOSIS — N898 Other specified noninflammatory disorders of vagina: Secondary | ICD-10-CM

## 2021-02-26 DIAGNOSIS — Z9851 Tubal ligation status: Secondary | ICD-10-CM | POA: Diagnosis not present

## 2021-02-26 LAB — WET PREP FOR TRICH, YEAST, CLUE

## 2021-02-26 MED ORDER — FLUCONAZOLE 150 MG PO TABS: 150.0000 mg | ORAL_TABLET | Freq: Every day | ORAL | 2 refills | Status: AC

## 2021-02-26 NOTE — Progress Notes (Signed)
[name] [name] 08-03-1970 [id number]   History:    51 y.o. G3P3L3  Divorced x 7 yrs.  Stable boyfriend. S/P TL.   RP:  Established patient presenting for annual gyn exam    HPI: Menses regular normal every month.  No BTB. No pelvic pain. Mild vaginal discharge.  Treated a yeast vaginitis with Fluconazole 3 wks ago.  No pain with IC.  S/P TL. Last Pap Neg 01/2020. Had Cryotherapy in 2005, Paps normal since then. Breasts normal.  Screening mammo Neg 03/2020.  BMI 25.89.  Stable Depression on Wellbutrin XL.  Fasting health labs with Fam MD.  BD Normal in 2022. Colono done in 2022.   Past medical history,surgical history, family history and social history were all reviewed and documented in the EPIC chart.  Gynecologic History Patient's last menstrual period was 02/05/2021 (exact date).   Obstetric History OB History  Gravida Para Term Preterm AB Living  3 3 3     3   SAB IAB Ectopic Multiple Live Births               # Outcome Date GA Lbr Len/2nd Weight Sex Delivery Anes PTL Lv  3 Term           2 Term           1 Term              ROS: A ROS was performed and pertinent positives and negatives are included in the history.  GENERAL: No fevers or chills. HEENT: No change in vision, no earache, sore throat or sinus congestion. NECK: No pain or stiffness. CARDIOVASCULAR: No chest pain or pressure. No palpitations. PULMONARY: No shortness of breath, cough or wheeze. GASTROINTESTINAL: No abdominal pain, nausea, vomiting or diarrhea, melena or bright red blood per rectum. GENITOURINARY: No urinary frequency, urgency, hesitancy or dysuria. MUSCULOSKELETAL: No joint or muscle pain, no back pain, no recent trauma. DERMATOLOGIC: No rash, no itching, no lesions. ENDOCRINE: No polyuria, polydipsia, no heat or cold intolerance. No recent change in weight. HEMATOLOGICAL: No anemia or easy bruising or bleeding. NEUROLOGIC: No headache, seizures, numbness, tingling or weakness. PSYCHIATRIC: No depression,  no loss of interest in normal activity or change in sleep pattern.     Exam:   BP 120/76    Pulse 76    Resp 16    Ht 5' 4.25" (1.632 m)    Wt 152 lb (68.9 kg)    LMP 02/05/2021 (Exact Date) Comment: btl   BMI 25.89 kg/m   Body mass index is 25.89 kg/m.  General appearance : Well developed well nourished female. No acute distress HEENT: Eyes: no retinal hemorrhage or exudates,  Neck supple, trachea midline, no carotid bruits, no thyroidmegaly Lungs: Clear to auscultation, no rhonchi or wheezes, or rib retractions  Heart: Regular rate and rhythm, no murmurs or gallops Breast:Examined in sitting and supine position were symmetrical in appearance, no palpable masses or tenderness,  no skin retraction, no nipple inversion, no nipple discharge, no skin discoloration, no axillary or supraclavicular lymphadenopathy Abdomen: no palpable masses or tenderness, no rebound or guarding Extremities: no edema or skin discoloration or tenderness  Pelvic: Vulva: Normal             Vagina: No gross lesions.  Mild increase in discharge.  Wet prep done.  Cervix: No gross lesions or discharge  Uterus  AV, normal size, shape and consistency, non-tender and mobile  Adnexa  Without masses or tenderness  Anus:  Normal  Wet Prep:  Yeasts present   Assessment/Plan:  51 y.o. female for annual exam   1. Well female exam with routine gynecological exam Menses regular normal every month.  No BTB. No pelvic pain. Mild vaginal discharge.  Treated a yeast vaginitis with Fluconazole 3 wks ago.  No pain with IC.  S/P TL. Last Pap Neg 01/2020. Had Cryotherapy in 2005, Paps normal since then. Breasts normal.  Screening mammo Neg 03/2020.  BMI 25.89.  Stable Depression on Wellbutrin XL.  Fasting health labs with Fam MD.  BD Normal in 2022. Colono done in 2022.  2. S/P tubal ligation  3. Vaginal discharge Yeast vaginitis.  Will treat with Fluconazole 1 tab PO daily x 3.  Prescription sent to pharmacy.  Other orders -  LORazepam (ATIVAN) 1 MG tablet; Take 1 mg by mouth daily as needed. - phentermine (ADIPEX-P) 37.5 MG tablet; Take 37.5 mg by mouth daily. - clindamycin-benzoyl peroxide (BENZACLIN) gel; Apply topically. - fluconazole (DIFLUCAN) 150 MG tablet; Take 1 tablet (150 mg total) by mouth daily for 3 days.   Princess Bruins MD, 8:56 AM 02/26/2021

## 2021-02-26 NOTE — Addendum Note (Signed)
Addended by: Princess Bruins on: 02/26/2021 09:23 AM   Modules accepted: Orders

## 2021-03-01 ENCOUNTER — Other Ambulatory Visit: Payer: Self-pay | Admitting: Obstetrics & Gynecology

## 2021-03-02 NOTE — Telephone Encounter (Signed)
Annual exam was on 02/26/21 per note "Stable Depression on Wellbutrin XL. "

## 2021-12-07 ENCOUNTER — Encounter: Payer: Self-pay | Admitting: Obstetrics & Gynecology

## 2021-12-07 ENCOUNTER — Ambulatory Visit: Payer: 59 | Admitting: Obstetrics & Gynecology

## 2021-12-07 VITALS — BP 118/80

## 2021-12-07 DIAGNOSIS — N921 Excessive and frequent menstruation with irregular cycle: Secondary | ICD-10-CM | POA: Diagnosis not present

## 2021-12-07 DIAGNOSIS — M549 Dorsalgia, unspecified: Secondary | ICD-10-CM

## 2021-12-07 DIAGNOSIS — N898 Other specified noninflammatory disorders of vagina: Secondary | ICD-10-CM

## 2021-12-07 LAB — EXTRA SPECIMEN

## 2021-12-07 LAB — CBC
HCT: 31.5 % — ABNORMAL LOW (ref 35.0–45.0)
Hemoglobin: 10.3 g/dL — ABNORMAL LOW (ref 11.7–15.5)
MCH: 28.3 pg (ref 27.0–33.0)
MCHC: 32.7 g/dL (ref 32.0–36.0)
MCV: 86.5 fL (ref 80.0–100.0)
MPV: 11.9 fL (ref 7.5–12.5)
Platelets: 357 10*3/uL (ref 140–400)
RBC: 3.64 10*6/uL — ABNORMAL LOW (ref 3.80–5.10)
RDW: 14 % (ref 11.0–15.0)
WBC: 9.1 10*3/uL (ref 3.8–10.8)

## 2021-12-07 MED ORDER — NORETHINDRONE 0.35 MG PO TABS: 1.0000 | ORAL_TABLET | Freq: Every day | ORAL | 4 refills | Status: DC

## 2021-12-07 MED ORDER — TINIDAZOLE 500 MG PO TABS: 1000.0000 mg | ORAL_TABLET | Freq: Two times a day (BID) | ORAL | 0 refills | Status: AC

## 2021-12-07 NOTE — Progress Notes (Addendum)
    JEANINE CAVEN [birth year] [id number]        51 y.o.  G3P3003  Stable boyfriend.  S/P TL.  RP: Menometrorrhagia x 3 weeks  HPI: Menses regular normal every month until LMP 11/11/21.  That period was heavier than usual and didn't stop, patient is still bleeding now with clots.  Very bad vaginal odor x a few days.  Mild pelvic cramps with lower back pains.  No UTI Sx otherwise. No vaginal d/c.  No fever. Declines STI screen.   OB History  Gravida Para Term Preterm AB Living  '3 3 3     3  '$ SAB IAB Ectopic Multiple Live Births               # Outcome Date GA Lbr Len/2nd Weight Sex Delivery Anes PTL Lv  3 Term           2 Term           1 Term             Past medical history,surgical history, problem list, medications, allergies, family history and social history were all reviewed and documented in the EPIC chart.   Directed ROS with pertinent positives and negatives documented in the history of present illness/assessment and plan.  Exam:  Vitals:   12/07/21 1355  BP: 118/80   General appearance:  Normal  Abdomen: Normal  Gynecologic exam:  Vulva normal.  Speculum:  Tampon with dark menstrual blood/clot at upper vagina. Strong foul odor.  Speculum removed. Retained tampon removed with vaginal exam.  Bimanual exam:  Uterus AV, NT, mobile, normal volume.  No adnexal mass, NT.  U/A: Dark yellow slightly cloudy, Nit Neg, Pro Neg, WBC 0-5, RBC 3-10, Bacteria Few.   Assessment/Plan:  51 y.o. G3P3003   1. Menometrorrhagia Menses regular normal every month until LMP 11/11/21.  That period was heavier than usual and didn't stop, patient is still bleeding now with clots.  Very bad vaginal odor x a few days.  Mild pelvic cramps with lower back pains.  No UTI Sx otherwise. No vaginal d/c.  No fever.  No  Declines STI screen.  Heavy vaginal bleeding x 3 weeks.  R/O anemia, CBC today.  FeSO4.  F/U Pelvic US for further investigation. Start on the Progestin only pill to control the heavy  bleeding.   - Urinalysis,Complete w/RFL Culture - CBC - US Transvaginal Non-OB; Future  2. Vaginal odor Retained tampon removed.  Treat with Tinidazole 2 tab PO BID x 2 days.  Prescription sent to pharmacy.  3. Back pain, unspecified back location, unspecified back pain laterality, unspecified chronicity Mild abnormality of U/A, will wait on U. Culture.  Further investigate [facility]. - Urinalysis,Complete w/RFL Culture  Other orders - Urine Culture - REFLEXIVE URINE CULTURE - tinidazole (TINDAMAX) 500 MG tablet; Take 2 tablets (1,000 mg total) by mouth 2 (two) times daily for 2 days. - norethindrone (MICRONOR) 0.35 MG tablet; Take 1 tablet (0.35 mg total) by mouth daily.   Princess Bruins MD, 2:29 PM 12/07/2021

## 2021-12-10 ENCOUNTER — Ambulatory Visit (INDEPENDENT_AMBULATORY_CARE_PROVIDER_SITE_OTHER): Payer: 59 | Admitting: Obstetrics & Gynecology

## 2021-12-10 ENCOUNTER — Encounter: Payer: Self-pay | Admitting: Obstetrics & Gynecology

## 2021-12-10 ENCOUNTER — Other Ambulatory Visit: Payer: Self-pay | Admitting: Obstetrics & Gynecology

## 2021-12-10 ENCOUNTER — Ambulatory Visit (INDEPENDENT_AMBULATORY_CARE_PROVIDER_SITE_OTHER): Payer: 59

## 2021-12-10 VITALS — BP 116/64 | HR 96

## 2021-12-10 DIAGNOSIS — N921 Excessive and frequent menstruation with irregular cycle: Secondary | ICD-10-CM | POA: Diagnosis not present

## 2021-12-10 DIAGNOSIS — Z9851 Tubal ligation status: Secondary | ICD-10-CM

## 2021-12-10 DIAGNOSIS — D219 Benign neoplasm of connective and other soft tissue, unspecified: Secondary | ICD-10-CM | POA: Diagnosis not present

## 2021-12-10 DIAGNOSIS — R935 Abnormal findings on diagnostic imaging of other abdominal regions, including retroperitoneum: Secondary | ICD-10-CM | POA: Diagnosis not present

## 2021-12-10 NOTE — Progress Notes (Signed)
[name] [name] 1970-12-08 [id number]        51 y.o.  G3P3003   RP: Menometrorrhagia for Pelvic US  HPI: Patient just seen on 12/07/21 for menometrorrhagia.  Menses regular normal every month until LMP 11/11/21.  That period was heavier than usual and didn't stop, patient was still bleeding with clots, now bleeding less on the Progestin only pill x 10/16th.  Very bad vaginal odor x a few days which was d/t a retained tampon which was removed on 10/16th.  A treatment of Tinidazole x 2 days was given. Mild pelvic cramps with lower back pains.  No UTI Sx otherwise. No vaginal d/c.  No fever.  Declines STI screen.   OB History  Gravida Para Term Preterm AB Living  '3 3 3     3  '$ SAB IAB Ectopic Multiple Live Births               # Outcome Date GA Lbr Len/2nd Weight Sex Delivery Anes PTL Lv  3 Term           2 Term           1 Term             Past medical history,surgical history, problem list, medications, allergies, family history and social history were all reviewed and documented in the EPIC chart.   Directed ROS with pertinent positives and negatives documented in the history of present illness/assessment and plan.  Exam:  Vitals:   12/10/21 1551  BP: 116/64  Pulse: 96  SpO2: 98%   General appearance:  Normal  Pelvic US today: Both transabdominal and transvaginal techniques were necessary to evaluate the anatomy.  Enlarged uterus with multiple intramural and subserosal fibroids.  5 fibroids were measured from 2.5 cm to 5.02 cm.  The overall uterine size was measured at 11.24 x 10.01 x 8.57 cm.  The central fibroids greatly distorted the endometrial cavity, which made the evaluation of the endometrial lining suboptimal transvaginally or transabdominally.  Right ovary is small with no visible follicles.  Left ovary normal size with a 2.3 x 2.0 cm simple follicle.  Cervical canal normal.  No free fluid in the pelvis.   Assessment/Plan:  51 y.o. G3P3003   1.  Menometrorrhagia Patient just seen on 12/07/21 for menometrorrhagia.  Menses regular normal every month until LMP 11/11/21.  That period was heavier than usual and didn't stop, patient was still bleeding with clots, now bleeding less on the Progestin only pill x 10/16th.  Very bad vaginal odor x a few days which was d/t a retained tampon which was removed on 10/16th.  A treatment of Tinidazole x 2 days was given. Mild pelvic cramps with lower back pains.  No UTI Sx otherwise. No vaginal d/c.  No fever.  Declines STI screen.  Pelvic US findings thoroughly reviewed. Sub-optimal visualization of the endometrial line d/t the Fibroids.  Decision to proceed with a Sonohysto/possible EBx per findings.  Patient voiced understanding and agreement with the plan.  Continue on the Progestin only pill until next visit. - [place] Sonohysterogram; Future  2. Abnormal ultrasound of endometrium Sub-optimal visualization of the endometrial line d/t the Fibroids.  Decision to proceed with a Sonohysto/possible EBx per findings.  Patient voiced understanding and agreement with the plan.  Continue on the Progestin only pill until next visit. - [place] Sonohysterogram; Future  3. Fibroids 5 Uterine Fibroids measured from 2.5 cm to 5.02 cm.  4. S/P tubal ligation  Princess Bruins MD, 4:14 PM 12/10/2021

## 2021-12-12 ENCOUNTER — Encounter: Payer: Self-pay | Admitting: Obstetrics & Gynecology

## 2021-12-17 LAB — URINE CULTURE
MICRO NUMBER:: 14055033
SPECIMEN QUALITY:: ADEQUATE

## 2021-12-17 LAB — URINALYSIS, COMPLETE W/RFL CULTURE
Bilirubin Urine: NEGATIVE
Glucose, UA: NEGATIVE
Hyaline Cast: NONE SEEN /LPF
Leukocyte Esterase: NEGATIVE
Nitrites, Initial: NEGATIVE
Protein, ur: NEGATIVE
Specific Gravity, Urine: 1.026 (ref 1.001–1.035)
pH: 5.5 (ref 5.0–8.0)

## 2021-12-17 LAB — CULTURE INDICATED

## 2021-12-24 IMAGING — MG MM DIGITAL SCREENING BILAT W/ TOMO AND CAD
8 series · 8 of 24 positions shown · non-contrast
Comparison: Previous exam(s).

CLINICAL DATA: Screening.

EXAM:
DIGITAL SCREENING BILATERAL MAMMOGRAM WITH TOMOSYNTHESIS AND CAD
TECHNIQUE: Bilateral screening digital craniocaudal and mediolateral oblique
mammograms were obtained. Bilateral screening digital breast
tomosynthesis was performed. The images were evaluated with
computer-aided detection.

[L MLO synth-2D]
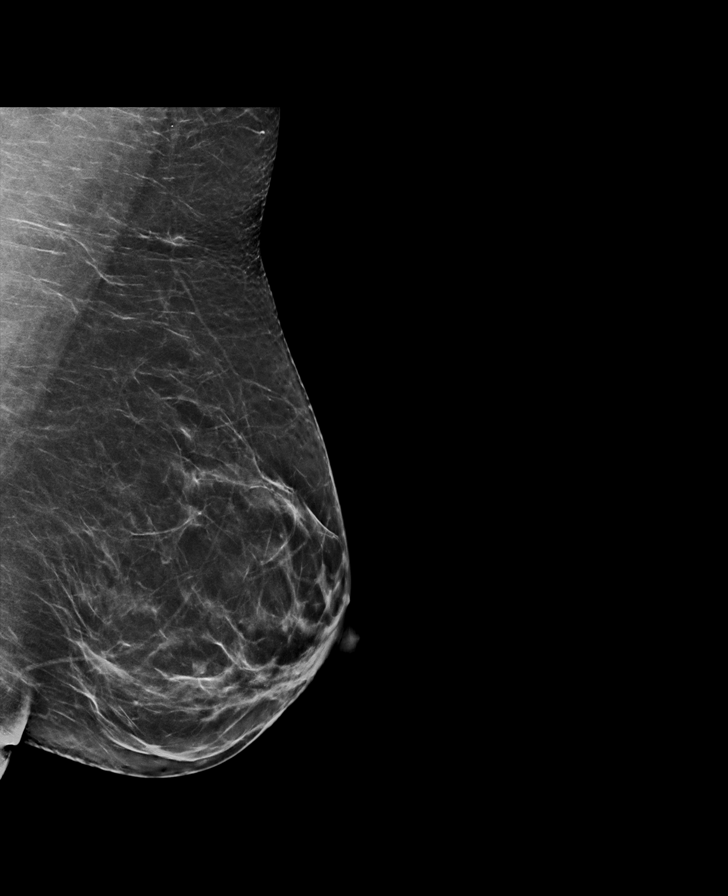

[R CC synth-2D]
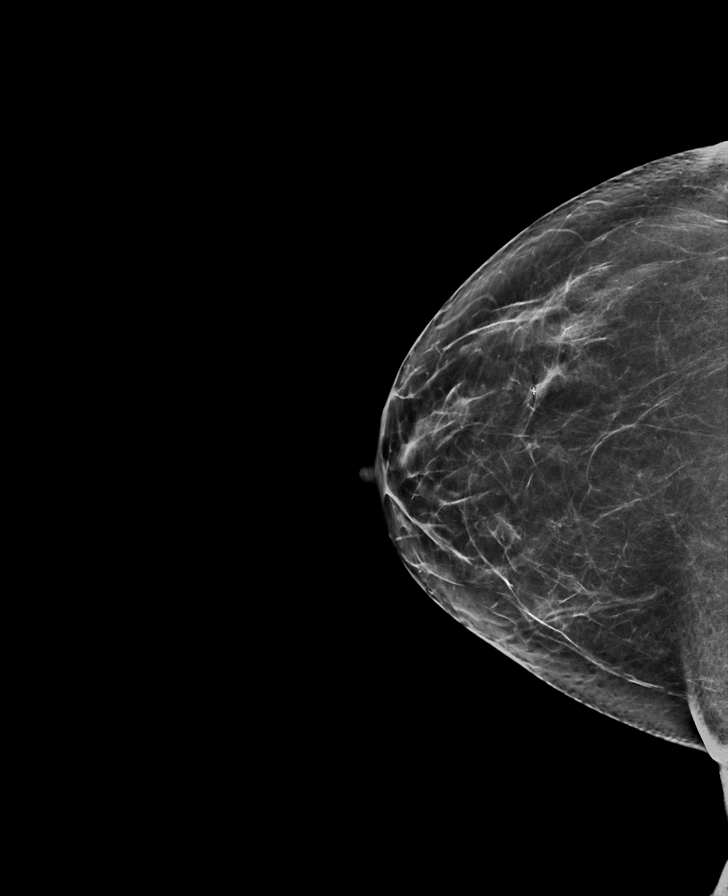

[L CC synth-2D]
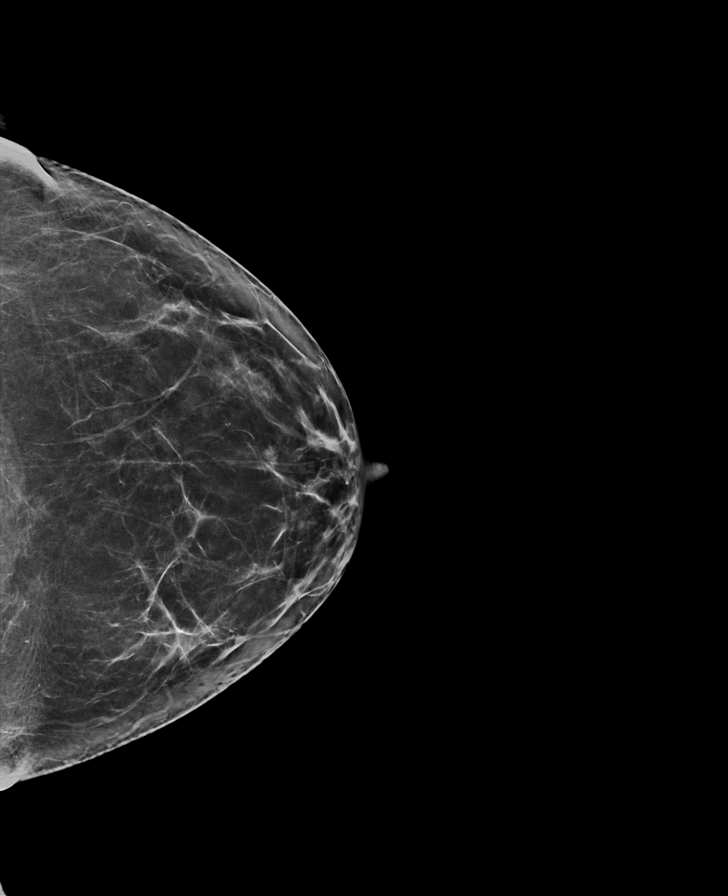

[R MLO synth-2D]
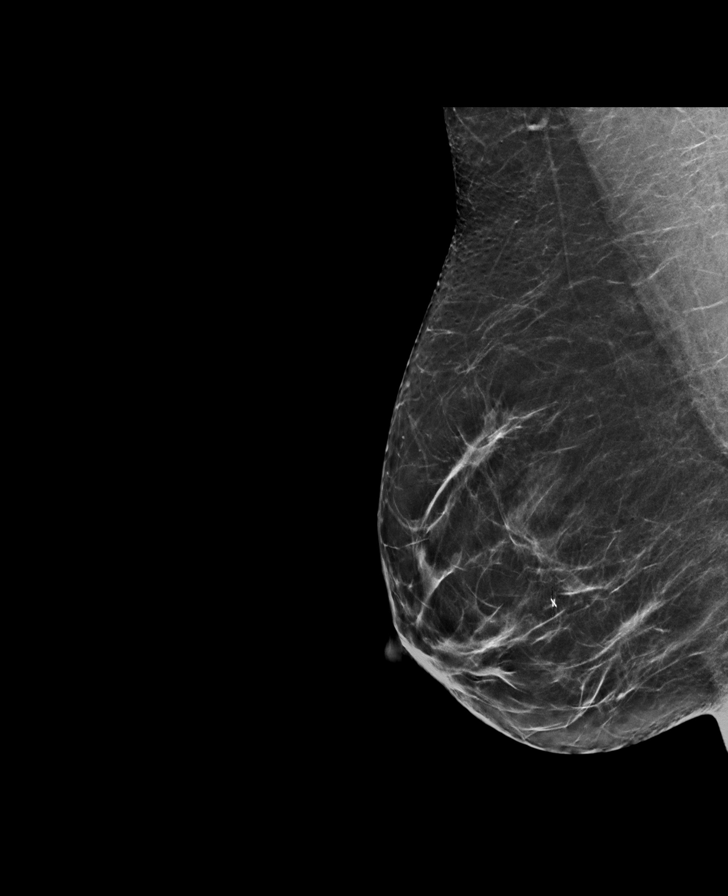

[R CC tomo · tomo slice 35/69.0]
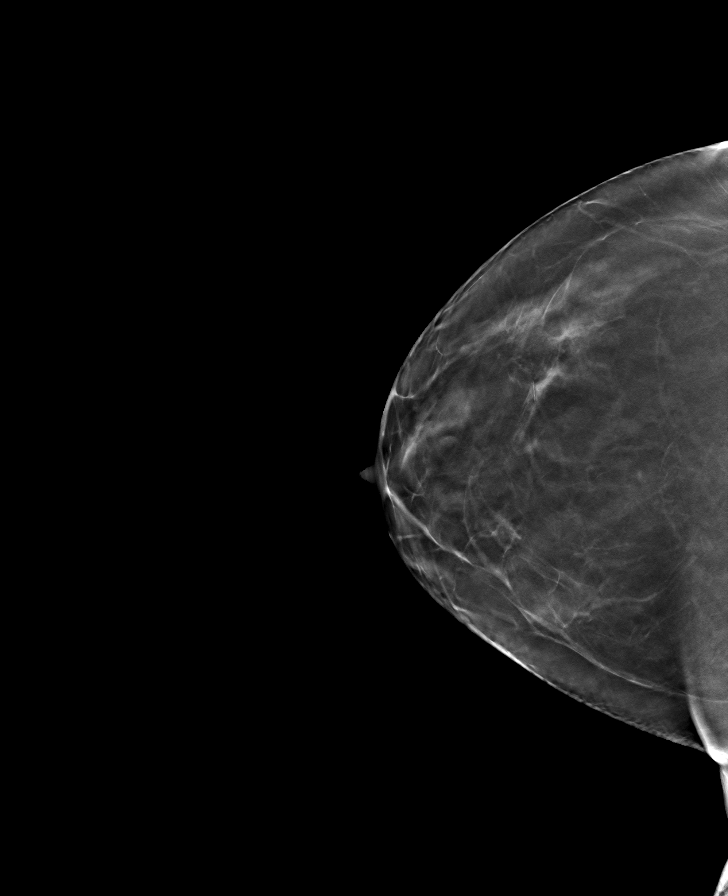

[L CC tomo · tomo slice 36/71.0]
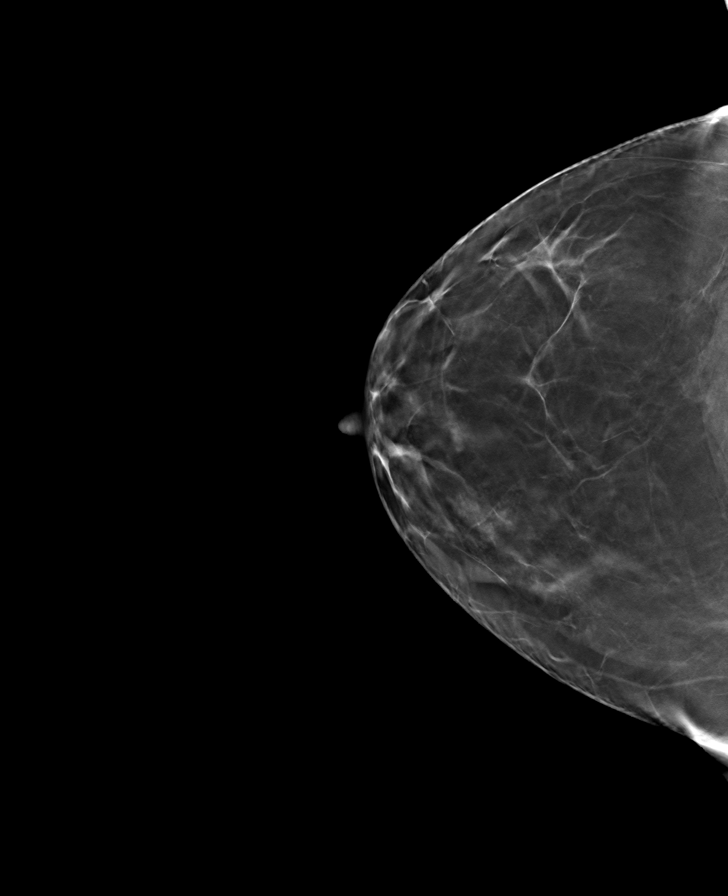

[R MLO tomo · tomo slice 35/69.0]
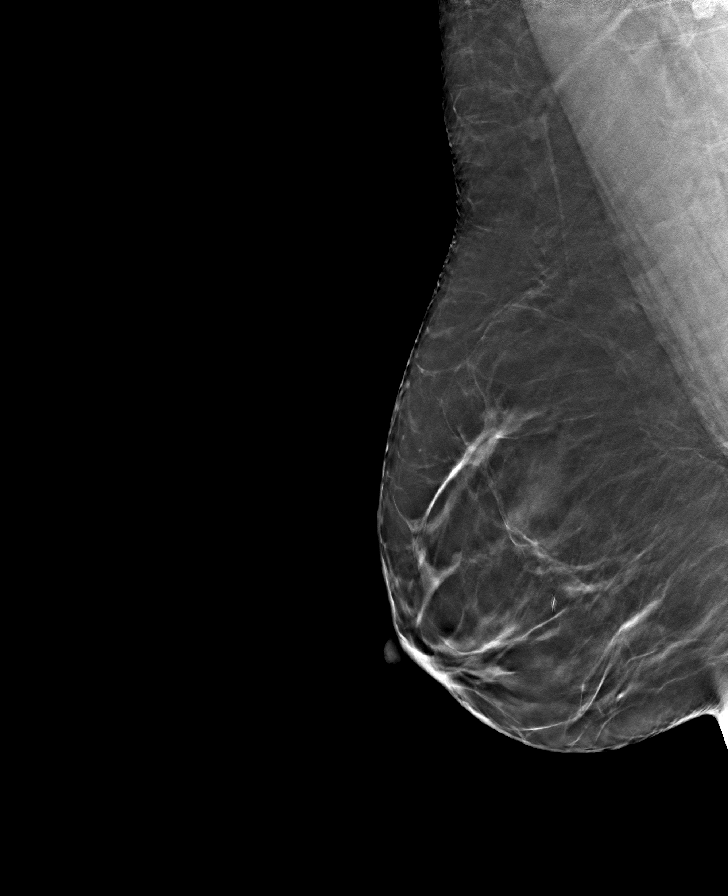

[L MLO tomo · tomo slice 37/72.0]
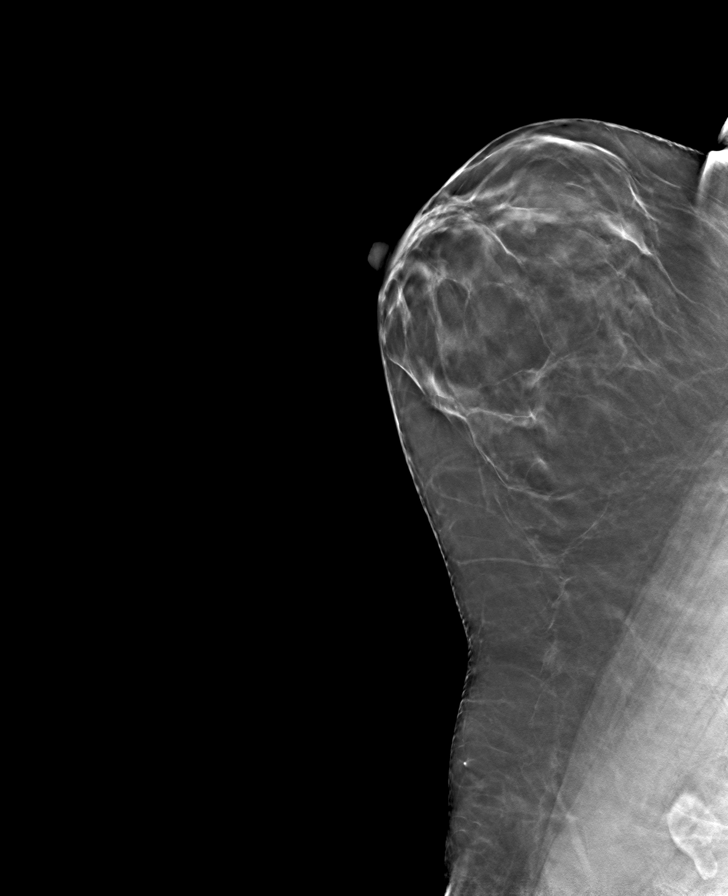

[8 of 24 positions shown; findings below may reference images not displayed]

ACR Breast Density Category b: There are scattered areas of
fibroglandular density.
FINDINGS: There are no findings suspicious for malignancy.
IMPRESSION: No mammographic evidence of malignancy. A result letter of this
screening mammogram will be mailed directly to the patient.

RECOMMENDATION:
Screening mammogram in one year. (Code:51-O-LD2)

BI-RADS CATEGORY  1: Negative.

## 2022-01-06 ENCOUNTER — Other Ambulatory Visit: Payer: Self-pay | Admitting: Obstetrics & Gynecology

## 2022-01-28 ENCOUNTER — Other Ambulatory Visit: Payer: 59

## 2022-01-28 ENCOUNTER — Ambulatory Visit: Payer: 59

## 2022-01-28 ENCOUNTER — Other Ambulatory Visit: Payer: 59 | Admitting: Obstetrics & Gynecology

## 2022-03-02 ENCOUNTER — Ambulatory Visit: Payer: 59 | Admitting: Obstetrics & Gynecology

## 2022-04-22 ENCOUNTER — Ambulatory Visit (INDEPENDENT_AMBULATORY_CARE_PROVIDER_SITE_OTHER): Payer: 59 | Admitting: Obstetrics & Gynecology

## 2022-04-22 ENCOUNTER — Encounter: Payer: Self-pay | Admitting: Obstetrics & Gynecology

## 2022-04-22 VITALS — BP 110/64 | HR 99 | Ht 64.0 in | Wt 160.0 lb

## 2022-04-22 DIAGNOSIS — N951 Menopausal and female climacteric states: Secondary | ICD-10-CM

## 2022-04-22 DIAGNOSIS — Z9851 Tubal ligation status: Secondary | ICD-10-CM | POA: Diagnosis not present

## 2022-04-22 DIAGNOSIS — N898 Other specified noninflammatory disorders of vagina: Secondary | ICD-10-CM

## 2022-04-22 DIAGNOSIS — Z01419 Encounter for gynecological examination (general) (routine) without abnormal findings: Secondary | ICD-10-CM | POA: Diagnosis not present

## 2022-04-22 DIAGNOSIS — N760 Acute vaginitis: Secondary | ICD-10-CM | POA: Diagnosis not present

## 2022-04-22 LAB — WET PREP FOR TRICH, YEAST, CLUE

## 2022-04-22 MED ORDER — TINIDAZOLE 500 MG PO TABS: 1000.0000 mg | ORAL_TABLET | Freq: Two times a day (BID) | ORAL | 0 refills | Status: AC

## 2022-04-22 MED ORDER — FLUCONAZOLE 150 MG PO TABS: 150.0000 mg | ORAL_TABLET | Freq: Once | ORAL | 0 refills | Status: AC

## 2022-04-22 NOTE — Progress Notes (Signed)
[name] [name] Sep 12, 1970 [id number]   History:    52 y.o.  G3P3L3  Divorced x 8 yrs.  Stable boyfriend. S/P TL.   RP:  Established patient presenting for annual gyn exam    HPI: Oligomenorrhea every 2-3 months.  LMP 04/07/22 lasted 10 days.  No BTB. No pelvic pain. Known Fibroids.  Mild vaginal discharge with odor.  No pain with IC.  S/P TL. Last Pap Neg 01/2020. Will repeat Pap next year. Had Cryotherapy in 2005, Paps normal since then. Breasts normal.  Screening mammo Neg 03/2020. Scheduling mammo now. BMI 27.46. Stable Depression on Wellbutrin XL.  Fasting health labs with Fam MD.  BD Normal in 2022. Colono done in 2022.    Past medical history,surgical history, family history and social history were all reviewed and documented in the EPIC chart.  Gynecologic History Patient's last menstrual period was 04/07/2022 (exact date).  Obstetric History OB History  Gravida Para Term Preterm AB Living  '3 3 3     3  '$ SAB IAB Ectopic Multiple Live Births               # Outcome Date GA Lbr Len/2nd Weight Sex Delivery Anes PTL Lv  3 Term           2 Term           1 Term              ROS: A ROS was performed and pertinent positives and negatives are included in the history. GENERAL: No fevers or chills. HEENT: No change in vision, no earache, sore throat or sinus congestion. NECK: No pain or stiffness. CARDIOVASCULAR: No chest pain or pressure. No palpitations. PULMONARY: No shortness of breath, cough or wheeze. GASTROINTESTINAL: No abdominal pain, nausea, vomiting or diarrhea, melena or bright red blood per rectum. GENITOURINARY: No urinary frequency, urgency, hesitancy or dysuria. MUSCULOSKELETAL: No joint or muscle pain, no back pain, no recent trauma. DERMATOLOGIC: No rash, no itching, no lesions. ENDOCRINE: No polyuria, polydipsia, no heat or cold intolerance. No recent change in weight. HEMATOLOGICAL: No anemia or easy bruising or bleeding. NEUROLOGIC: No headache, seizures, numbness,  tingling or weakness. PSYCHIATRIC: No depression, no loss of interest in normal activity or change in sleep pattern.     Exam:   BP 110/64   Pulse 99   Ht '5\' 4"'$  (1.626 m)   Wt 160 lb (72.6 kg)   LMP 04/07/2022 (Exact Date) Comment: sexually active-btl  SpO2 98%   BMI 27.46 kg/m   Body mass index is 27.46 kg/m.  General appearance : Well developed well nourished female. No acute distress HEENT: Eyes: no retinal hemorrhage or exudates,  Neck supple, trachea midline, no carotid bruits, no thyroidmegaly Lungs: Clear to auscultation, no rhonchi or wheezes, or rib retractions  Heart: Regular rate and rhythm, no murmurs or gallops Breast:Examined in sitting and supine position were symmetrical in appearance, no palpable masses or tenderness,  no skin retraction, no nipple inversion, no nipple discharge, no skin discoloration, no axillary or supraclavicular lymphadenopathy Abdomen: no palpable masses or tenderness, no rebound or guarding Extremities: no edema or skin discoloration or tenderness  Pelvic: Vulva: Normal             Vagina: No gross lesions.  Discharge present, Wet prep done.  Cervix: No gross lesions or discharge  Uterus  AV, stable at about 10 cm, nodular, non-tender and mobile  Adnexa  Without masses or tenderness  Anus: Normal  Wet prep: Clue cells with odor present   Assessment/Plan:  52 y.o. female for annual exam   1. Well female exam with routine gynecological exam Oligomenorrhea every 2-3 months.  LMP 04/07/22 lasted 10 days.  No BTB. No pelvic pain. Known Fibroids.  Mild vaginal discharge with odor.  No pain with IC.  S/P TL. Last Pap Neg 01/2020. Will repeat Pap next year. Had Cryotherapy in 2005, Paps normal since then. Breasts normal.  Screening mammo Neg 03/2020. Scheduling mammo now. BMI 27.46. Stable Depression on Wellbutrin XL.  Fasting health labs with Fam MD.  BD Normal in 2022. Colono done in 2022.  2. S/P tubal ligation  3.  Perimenopause Oligomenorrhea every 2-3 months, normal flow, no BTB.  Will observe.  Abnormal bleeding precautions reviewed.  4. Vaginal discharge BV confirmed by wet prep. Will treat with Tinidazole.  Usage reviewed and prescription sent to pharmacy.  Recommend 1 tab of Fluconazole post ABTx. - WET PREP FOR [place], YEAST, CLUE   Other orders - tinidazole (TINDAMAX) 500 MG tablet; Take 2 tablets (1,000 mg total) by mouth 2 (two) times daily for 2 days. - fluconazole (DIFLUCAN) 150 MG tablet; Take 1 tablet (150 mg total) by mouth once for 1 dose.   Princess Bruins MD, 9:53 AM

## 2022-06-18 ENCOUNTER — Other Ambulatory Visit: Payer: Self-pay | Admitting: Obstetrics & Gynecology

## 2022-11-22 ENCOUNTER — Telehealth: Payer: Self-pay | Admitting: *Deleted

## 2022-11-22 NOTE — Telephone Encounter (Signed)
Spoke with patient. Patient requesting Rx for hot flashes. Reports increase in hot flashes, 4- 5 per day and they are waking her up at night.  Last MMG 04/08/20, recommended updated MMG. OV scheduled with Dr. Kennith Center on 10/15 at 12pm for further discussion. Patient verbalizes understanding. She will work on scheduling screening MMG.   Last AEX 04/22/22  Routing to provider for final review. Patient is agreeable to disposition. Will close encounter.

## 2022-11-22 NOTE — Telephone Encounter (Signed)
Patient left message requesting prescription for hot flashes.

## 2022-12-07 ENCOUNTER — Ambulatory Visit: Payer: 59 | Admitting: Obstetrics and Gynecology

## 2023-06-23 ENCOUNTER — Other Ambulatory Visit (HOSPITAL_BASED_OUTPATIENT_CLINIC_OR_DEPARTMENT_OTHER): Payer: Self-pay | Admitting: Physician Assistant

## 2023-06-23 DIAGNOSIS — Z1231 Encounter for screening mammogram for malignant neoplasm of breast: Secondary | ICD-10-CM

## 2023-06-27 ENCOUNTER — Ambulatory Visit (HOSPITAL_BASED_OUTPATIENT_CLINIC_OR_DEPARTMENT_OTHER)
Admission: RE | Admit: 2023-06-27 | Discharge: 2023-06-27 | Disposition: A | Discharge status: Home / Self Care | Source: Ambulatory Visit | Attending: Physician Assistant | Admitting: Physician Assistant

## 2023-06-27 ENCOUNTER — Encounter (HOSPITAL_BASED_OUTPATIENT_CLINIC_OR_DEPARTMENT_OTHER): Payer: Self-pay

## 2023-06-27 DIAGNOSIS — Z1231 Encounter for screening mammogram for malignant neoplasm of breast: Secondary | ICD-10-CM | POA: Insufficient documentation

## 2023-08-03 ENCOUNTER — Encounter: Payer: Self-pay | Admitting: Obstetrics and Gynecology

## 2023-08-03 ENCOUNTER — Ambulatory Visit (INDEPENDENT_AMBULATORY_CARE_PROVIDER_SITE_OTHER): Admitting: Obstetrics and Gynecology

## 2023-08-03 ENCOUNTER — Other Ambulatory Visit (HOSPITAL_COMMUNITY)
Admission: RE | Admit: 2023-08-03 | Discharge: 2023-08-03 | Disposition: A | Discharge status: Home / Self Care | Source: Ambulatory Visit | Attending: Obstetrics and Gynecology | Admitting: Obstetrics and Gynecology

## 2023-08-03 VITALS — BP 110/72 | Temp 99.2°F | Resp 16 | Ht 63.75 in | Wt 169.0 lb

## 2023-08-03 DIAGNOSIS — Z124 Encounter for screening for malignant neoplasm of cervix: Secondary | ICD-10-CM | POA: Insufficient documentation

## 2023-08-03 DIAGNOSIS — Z01419 Encounter for gynecological examination (general) (routine) without abnormal findings: Secondary | ICD-10-CM | POA: Diagnosis not present

## 2023-08-03 DIAGNOSIS — Z1331 Encounter for screening for depression: Secondary | ICD-10-CM | POA: Diagnosis not present

## 2023-08-03 NOTE — Progress Notes (Signed)
 53 y.o. G49P3003 female s/p BTL with known fibroids here for annual exam. Divorced. 16 and 17yo daughters.  No LMP recorded. Patient is perimenopausal.  LMP ~Sept 2024  She reports no issues.  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 Volleyball & walking  Smoker: no  Garment/textile technologist Visit from 08/03/2023 in [facility]  PHQ-2 Total Score 0         GYN HISTORY: CIN 1, 2005  OB History  Gravida Para Term Preterm AB Living  3 3 3   3   SAB IAB Ectopic Multiple Live Births          # Outcome Date GA Lbr Len/2nd Weight Sex Type Anes PTL Lv  3 Term           2 Term           1 Term            Past Medical History:  Diagnosis Date   CIN I (cervical intraepithelial neoplasia I) 06/2003   HSV-2 infection    Vertigo    Past Surgical History:  Procedure Laterality Date   BREAST BIOPSY Right    negative   CESAREAN SECTION  [phone]   COLPOSCOPY     GYNECOLOGIC CRYOSURGERY  2005   LGSIL   TUBAL LIGATION  2009   Current Outpatient Medications on File Prior to Visit  Medication Sig Dispense Refill   BIOTIN PO Take by mouth.     buPROPion  (WELLBUTRIN  XL) 150 MG 24 hr tablet TAKE 1 TABLET BY MOUTH DAILY 30 tablet 12   ibuprofen (ADVIL) 400 MG tablet Take 400 mg by mouth every 6 (six) hours as needed.     LORazepam (ATIVAN) 1 MG tablet Take 1 mg by mouth daily as needed.     MAGNESIUM PO Take by mouth.     Multiple Vitamin (MULTIVITAMIN) tablet Take 1 tablet by mouth daily.     Vitamin D , Ergocalciferol , (DRISDOL) 1.25 MG (50000 UNIT) CAPS capsule Take 50,000 Units by mouth once a week.     No  current facility-administered medications on file prior to visit.   Social History   Socioeconomic History   Marital status: Divorced    Spouse name: Not on file   Number of children: Not on file   Years of education: Not on file   Highest education level: Not on file  Occupational History   Not on file  Tobacco Use   Smoking status: Former   Smokeless tobacco: Never  Vaping Use   Vaping status: Former  Substance and Sexual Activity   Alcohol use: Yes    Alcohol/week: 2.0 standard drinks of alcohol    Types: 2 Standard drinks or equivalent per week   Drug use: No   Sexual activity: Yes    Partners: Male    Birth control/protection: Surgical    Comment: BTL-1st intercourse 57 yo- partners- more than 5, micronor   Other Topics Concern   Not on file  Social History Narrative   Not on file   Social Drivers of Health   Financial Resource Strain: Not on file  Food Insecurity: Not on file  Transportation Needs: Not on file  Physical Activity: Not on file  Stress: Not on file  Social Connections: Unknown (09/12/2022)   [facility]   Social Network    Social Network: Not on file  Intimate Partner Violence: Unknown (09/12/2022)   Received from [facility]   HITS    Physically Hurt: Not on file    Insult or Talk Down To: Not on file    Threaten Physical Harm: Not on file    Scream or Curse: Not on file   Family History  Problem Relation Age of Onset   Hypertension Mother    Other Mother        comitted suicide and may have had a cancer , had lost 40 pounds in less than 2 months   Hyperlipidemia Mother    Thyroid disease Mother        underactive   Diabetes Father    Colon cancer Maternal Uncle    Hypertension Maternal Grandmother    Stroke Maternal Grandmother    Colon cancer Maternal Grandfather    No Known Allergies   PE Today's Vitals   08/03/23 1324  BP: 110/72  Resp: 16  Temp: 99.2 F (37.3 C)  TempSrc: Oral  SpO2: 98%  Weight: 169  lb (76.7 kg)  Height: 5' 3.75 (1.619 m)   Body mass index is 29.24 kg/m.  Physical Exam Vitals reviewed. Exam conducted with a chaperone present.  Constitutional:      General: She is not in acute distress.    Appearance: Normal appearance.  HENT:     Head: Normocephalic and atraumatic.     Nose: Nose normal.  Eyes:     Extraocular Movements: Extraocular movements intact.     Conjunctiva/sclera: Conjunctivae normal.  Neck:     Thyroid: No thyroid mass, thyromegaly or thyroid tenderness.  Pulmonary:     Effort: Pulmonary effort is normal.  Chest:     Chest wall: No mass or tenderness.  Breasts:    Right: Normal. No swelling, mass, nipple discharge, skin change or tenderness.     Left: Normal. No swelling, mass, nipple discharge, skin change or tenderness.  Abdominal:     General: There is no distension.     Palpations: Abdomen is soft.     Tenderness: There is no abdominal tenderness.  Genitourinary:    General: Normal vulva.     Exam position: Lithotomy position.     Urethra: No prolapse.     Vagina: Normal. No vaginal discharge or bleeding.     Cervix: Normal. No lesion.     Uterus: Normal. Not enlarged and not tender.      Adnexa: Right adnexa normal and left adnexa normal.     Comments: Anterior left sided cervix Bulky, enlarged uterus ~12wk, posterior Musculoskeletal:        General: Normal range of motion.     Cervical back: Normal range of motion.  Lymphadenopathy:     Upper Body:     Right upper body: No axillary adenopathy.     Left upper body: No axillary adenopathy.     Lower Body: No right inguinal adenopathy. No left inguinal adenopathy.  Skin:    General: Skin is warm and dry.  Neurological:     General: No focal deficit present.     Mental Status: She is alert.  Psychiatric:        Mood and Affect: Mood normal.        Behavior: Behavior normal.  Assessment and Plan:        Well woman exam with routine gynecological exam Assessment &  Plan: Cervical cancer screening performed according to ASCCP guidelines. Encouraged annual mammogram screening Colonoscopy UTD DXA UTD Labs and immunizations with her primary Encouraged safe sexual practices as indicated Encouraged healthy lifestyle practices with diet and exercise For patients under 50-70yo, I recommend 1200mg  calcium daily and 600IU of vitamin D  daily.    Cervical cancer screening -     Cytology - PAP  Negative depression screening   Yale Golla Hadassah Letters, MD

## 2023-08-03 NOTE — Patient Instructions (Signed)

## 2023-08-03 NOTE — Assessment & Plan Note (Signed)
 Cervical cancer screening performed according to ASCCP guidelines. Encouraged annual mammogram screening Colonoscopy UTD DXA UTD Labs and immunizations with her primary Encouraged safe sexual practices as indicated Encouraged healthy lifestyle practices with diet and exercise For patients under 50-54yo, I recommend 1200mg  calcium daily and 600IU of vitamin D daily.

## 2023-08-04 ENCOUNTER — Ambulatory Visit: Payer: Self-pay | Admitting: Obstetrics and Gynecology

## 2023-08-04 LAB — CYTOLOGY - PAP
Comment: NEGATIVE
Diagnosis: NEGATIVE
High risk HPV: NEGATIVE
